# Patient Record
Sex: Male | Born: 1980 | Race: Black or African American | Hispanic: No | Marital: Married | Smoking: Never smoker
Health system: Southern US, Community
[De-identification: ages and names within clinical notes are randomized; demographics above are authoritative.]

## PROBLEM LIST (undated history)

## (undated) HISTORY — PX: WISDOM TOOTH EXTRACTION: SHX21

## (undated) HISTORY — PX: OTHER SURGICAL HISTORY: SHX169

---

## 1999-05-03 ENCOUNTER — Emergency Department (HOSPITAL_COMMUNITY): Admission: EM | Admit: 1999-05-03 | Discharge: 1999-05-03 | Payer: Self-pay | Admitting: Emergency Medicine

## 1999-05-07 ENCOUNTER — Encounter: Payer: Self-pay | Admitting: Emergency Medicine

## 1999-05-07 ENCOUNTER — Emergency Department (HOSPITAL_COMMUNITY): Admission: EM | Admit: 1999-05-07 | Discharge: 1999-05-07 | Payer: Self-pay | Admitting: Emergency Medicine

## 2003-11-11 ENCOUNTER — Emergency Department (HOSPITAL_COMMUNITY): Admission: EM | Admit: 2003-11-11 | Discharge: 2003-11-11 | Payer: Self-pay | Admitting: Family Medicine

## 2011-06-25 ENCOUNTER — Ambulatory Visit (INDEPENDENT_AMBULATORY_CARE_PROVIDER_SITE_OTHER): Payer: BC Managed Care – PPO | Admitting: Internal Medicine

## 2011-06-25 VITALS — BP 130/79 | HR 62 | Temp 98.3°F | Resp 16 | Ht 70.25 in | Wt 176.0 lb

## 2011-06-25 DIAGNOSIS — S39012A Strain of muscle, fascia and tendon of lower back, initial encounter: Secondary | ICD-10-CM

## 2011-06-25 MED ORDER — CYCLOBENZAPRINE HCL 10 MG PO TABS: 10.0000 mg | ORAL_TABLET | Freq: Every day | ORAL | Status: AC

## 2011-06-25 NOTE — Progress Notes (Signed)
  Subjective:    Patient ID: Jesse Carney, male    DOB: 01/19/1981, 31 y.o.   MRN: [id number]  HPI Presents with a four-week history of low back pain Started after a wrestling event with his friends Jesse Carney is a persistent dull pain present most of the day when sitting or driving Rarely affects night time There is some radicular symptoms to the lateral right buttock   Review of [employer] with no prior back injury     Objective:   Physical Exam Vital signs stable Lumbosacral area he has tenderness over the sacral area bilaterally but not the coccyx and not above the iliac crest Straight leg raise is tight but full to 90 DTRs are symmetrical and full Pain is exaggerated by twist or reach There is no peripheral sensory changes       Assessment & Plan:  Problem #1 lumbosacral strain  Handout given for her rehabilitation exercises Flexeril 10 at bedtime and Mobic 15 daily for 2-3 weeks If not will follow up for reevaluation and consideration for PT

## 2012-09-12 ENCOUNTER — Ambulatory Visit: Payer: BC Managed Care – PPO

## 2012-09-12 ENCOUNTER — Ambulatory Visit (INDEPENDENT_AMBULATORY_CARE_PROVIDER_SITE_OTHER): Payer: BC Managed Care – PPO | Admitting: Family Medicine

## 2012-09-12 VITALS — BP 124/74 | HR 58 | Temp 98.0°F | Resp 16 | Ht 72.0 in | Wt 175.0 lb

## 2012-09-12 DIAGNOSIS — M549 Dorsalgia, unspecified: Secondary | ICD-10-CM

## 2012-09-12 DIAGNOSIS — G2589 Other specified extrapyramidal and movement disorders: Secondary | ICD-10-CM

## 2012-09-12 DIAGNOSIS — R279 Unspecified lack of coordination: Secondary | ICD-10-CM

## 2012-09-12 DIAGNOSIS — M546 Pain in thoracic spine: Secondary | ICD-10-CM

## 2012-09-12 DIAGNOSIS — T148XXA Other injury of unspecified body region, initial encounter: Secondary | ICD-10-CM

## 2012-09-12 MED ORDER — TRAMADOL HCL 50 MG PO TABS: 50.0000 mg | ORAL_TABLET | Freq: Three times a day (TID) | ORAL | Status: DC | PRN

## 2012-09-12 MED ORDER — METHOCARBAMOL 500 MG PO TABS: 500.0000 mg | ORAL_TABLET | Freq: Two times a day (BID) | ORAL | Status: DC | PRN

## 2012-09-12 NOTE — Patient Instructions (Signed)
Shoulder Exercises EXERCISES  RANGE OF MOTION (ROM) AND STRETCHING EXERCISES These exercises may help you when beginning to rehabilitate your injury. Your symptoms may resolve with or without further involvement from your physician, physical therapist or athletic trainer. While completing these exercises, remember:   Restoring tissue flexibility helps normal motion to return to the joints. This allows healthier, less painful movement and activity.  An effective stretch should be held for at least 30 seconds.  A stretch should never be painful. You should only feel a gentle lengthening or release in the stretched tissue. ROM - Pendulum  Bend at the waist so that your right / left arm falls away from your body. Support yourself with your opposite hand on a solid surface, such as a table or a countertop.  Your right / left arm should be perpendicular to the ground. If it is not perpendicular, you need to lean over farther. Relax the muscles in your right / left arm and shoulder as much as possible.  Gently sway your hips and trunk so they move your right / left arm without any use of your right / left shoulder muscles.  Progress your movements so that your right / left arm moves side to side, then forward and backward, and finally, both clockwise and counterclockwise.  Complete __________ repetitions in each direction. Many people use this exercise to relieve discomfort in their shoulder as well as to gain range of motion. Repeat __________ times. Complete this exercise __________ times per day. STRETCH  Flexion, Standing  Stand with good posture. With an underhand grip on your right / left hand and an overhand grip on the opposite hand, grasp a broomstick or cane so that your hands are a little more than shoulder-width apart.  Keeping your right / left elbow straight and shoulder muscles relaxed, push the stick with your opposite hand to raise your right / left arm in front of your body and  then overhead. Raise your arm until you feel a stretch in your right / left shoulder, but before you have increased shoulder pain.  Try to avoid shrugging your right / left shoulder as your arm rises by keeping your shoulder blade tucked down and toward your mid-back spine. Hold __________ seconds.  Slowly return to the starting position. Repeat __________ times. Complete this exercise __________ times per day. STRETCH - Internal Rotation  Place your right / left hand behind your back, palm-up.  Throw a towel or belt over your opposite shoulder. Grasp the towel/belt with your right / left hand.  While keeping an upright posture, gently pull up on the towel/belt until you feel a stretch in the front of your right / left shoulder.  Avoid shrugging your right / left shoulder as your arm rises by keeping your shoulder blade tucked down and toward your mid-back spine.  Hold __________. Release the stretch by lowering your opposite hand. Repeat __________ times. Complete this exercise __________ times per day. STRETCH - External Rotation and Abduction  Stagger your stance through a doorframe. It does not matter which foot is forward.  As instructed by your physician, physical therapist or athletic trainer, place your hands:  And forearms above your head and on the door frame.  And forearms at head-height and on the door frame.  At elbow-height and on the door frame.  Keeping your head and chest upright and your stomach muscles tight to prevent over-extending your low-back, slowly shift your weight onto your front foot until you feel   a stretch across your chest and/or in the front of your shoulders.  Hold __________ seconds. Shift your weight to your back foot to release the stretch. Repeat __________ times. Complete this stretch __________ times per day.  STRENGTHENING EXERCISES  These exercises may help you when beginning to rehabilitate your injury. They may resolve your symptoms with  or without further involvement from your physician, physical therapist or athletic trainer. While completing these exercises, remember:   Muscles can gain both the endurance and the strength needed for everyday activities through controlled exercises.  Complete these exercises as instructed by your physician, physical therapist or athletic trainer. Progress the resistance and repetitions only as guided.  You may experience muscle soreness or fatigue, but the pain or discomfort you are trying to eliminate should never worsen during these exercises. If this pain does worsen, stop and make certain you are following the directions exactly. If the pain is still present after adjustments, discontinue the exercise until you can discuss the trouble with your clinician.  If advised by your physician, during your recovery, avoid activity or exercises which involve actions that place your right / left hand or elbow above your head or behind your back or head. These positions stress the tissues which are trying to heal. STRENGTH - Scapular Depression and Adduction  With good posture, sit on a firm chair. Supported your arms in front of you with pillows, arm rests or a table top. Have your elbows in line with the sides of your body.  Gently draw your shoulder blades down and toward your mid-back spine. Gradually increase the tension without tensing the muscles along the top of your shoulders and the back of your neck.  Hold for __________ seconds. Slowly release the tension and relax your muscles completely before completing the next repetition.  After you have practiced this exercise, remove the arm support and complete it in standing as well as sitting. Repeat __________ times. Complete this exercise __________ times per day.  STRENGTH - External Rotators  Secure a rubber exercise band/tubing to a fixed object so that it is at the same height as your right / left elbow when you are standing or sitting on a  firm surface.  Stand or sit so that the secured exercise band/tubing is at your side that is not injured.  Bend your elbow 90 degrees. Place a folded towel or small pillow under your right / left arm so that your elbow is a few inches away from your side.  Keeping the tension on the exercise band/tubing, pull it away from your body, as if pivoting on your elbow. Be sure to keep your body steady so that the movement is only coming from your shoulder rotating.  Hold __________ seconds. Release the tension in a controlled manner as you return to the starting position. Repeat __________ times. Complete this exercise __________ times per day.  STRENGTH - Supraspinatus  Stand or sit with good posture. Grasp a __________ weight or an exercise band/tubing so that your hand is "thumbs-up," like when you shake hands.  Slowly lift your right / left hand from your thigh into the air, traveling about 30 degrees from straight out at your side. Lift your hand to shoulder height or as far as you can without increasing any shoulder pain. Initially, many people do not lift their hands above shoulder height.  Avoid shrugging your right / left shoulder as your arm rises by keeping your shoulder blade tucked down and toward your   mid-back spine.  Hold for __________ seconds. Control the descent of your hand as you slowly return to your starting position. Repeat __________ times. Complete this exercise __________ times per day.  STRENGTH - Shoulder Extensors  Secure a rubber exercise band/tubing so that it is at the height of your shoulders when you are either standing or sitting on a firm arm-less chair.  With a thumbs-up grip, grasp an end of the band/tubing in each hand. Straighten your elbows and lift your hands straight in front of you at shoulder height. Step back away from the secured end of band/tubing until it becomes tense.  Squeezing your shoulder blades together, pull your hands down to the sides of  your thighs. Do not allow your hands to go behind you.  Hold for __________ seconds. Slowly ease the tension on the band/tubing as you reverse the directions and return to the starting position. Repeat __________ times. Complete this exercise __________ times per day.  STRENGTH - Scapular Retractors  Secure a rubber exercise band/tubing so that it is at the height of your shoulders when you are either standing or sitting on a firm arm-less chair.  With a palm-down grip, grasp an end of the band/tubing in each hand. Straighten your elbows and lift your hands straight in front of you at shoulder height. Step back away from the secured end of band/tubing until it becomes tense.  Squeezing your shoulder blades together, draw your elbows back as you bend them. Keep your upper arm lifted away from your body throughout the exercise.  Hold __________ seconds. Slowly ease the tension on the band/tubing as you reverse the directions and return to the starting position. Repeat __________ times. Complete this exercise __________ times per day. STRENGTH  Scapular Depressors  Find a sturdy chair without wheels, such as a from a dining room table.  Keeping your feet on the floor, lift your bottom from the seat and lock your elbows.  Keeping your elbows straight, allow gravity to pull your body weight down. Your shoulders will rise toward your ears.  Raise your body against gravity by drawing your shoulder blades down your back, shortening the distance between your shoulders and ears. Although your feet should always maintain contact with the floor, your feet should progressively support less body weight as you get stronger.  Hold __________ seconds. In a controlled and slow manner, lower your body weight to begin the next repetition. Repeat __________ times. Complete this exercise __________ times per day.  Document Released: 01/25/2005 Document Revised: 06/05/2011 Document Reviewed: 06/25/2008 [facility]  Patient Information 2014 [place], [place]. Scapular Winging  with Rehab  Scapular winging syndrome is also known as serratus anterior palsy or long thoracic nerve injury. The condition is an uncommon injury to the nervous system. The condition is caused by injury to the long thoracic nerve that runs through the neck and shoulder. Injury to the shoulder, such as a fall or repetitive stress on the shoulder causes the nerve to become stretched. Occasionally the injury is the result of an infection of the nerve. Damage to the long thoracic nerve results in weakness of the serratus anterior muscle. The serratus anterior muscle is responsible for controlling the shoulder blade (scapula). Weakness in this muscle results in a instability (winging) of the scapula. SYMPTOMS   Pain and weakness in the shoulder (usually the back of the shoulder) that is often diffuse or unable to localize.  Loss of or decrease in shoulder function.  Upper back pain while sitting,  due to the scapula pressing on the back of the chair.  Visible deformity in the back of the shoulder. CAUSES  Scapular winging is caused by stretching of the long thoracic nerve. Common mechanisms of injury include:  Viral illness.  Repetitive and/or stressful use of the shoulder.  Falling onto the shoulder with the head and neck stretched away from the shoulder. RISK INCREASES WITH:  Contact sports (football, rugby, lacrosse, or soccer).  Activities involving overhead arm movement (baseball, volleyball, or racquet sports).  Poor strength and flexibility. PREVENTION  Warm up and stretch properly before activity.  Allow for adequate recovery between workouts.  Maintain physical fitness:  Strength, flexibility, and endurance.  Cardiovascular fitness.  Learn and use proper technique. When possible, have a coach correct improper technique. PROGNOSIS  Scapular winging normally resolves spontaneously within 18 months. In rare  circumstances surgery is recommended.  RELATED COMPLICATIONS   Permanent nerve damage, including pain, numbness, tingle, or weakness.  Shoulder weakness.  Recurrent shoulder pain.  Inability to compete in athletics. TREATMENT Treatment initially involves resting from any activities that aggravate your symptoms. The use of ice and medication may help reduce pain and inflammation. The use of strengthening and stretching exercises may help reduce pain with activity, specifically shoulder exercises that improve range of motion. These exercises may be performed at home or with referral to a therapist. If symptoms persist for greater than 6 months despite non-surgical (conservative) treatment, then surgery may be recommended. Surgery is only used for the most serious cases and the purpose is to regain function, not to allow an athlete to return to sports. MEDICATION   If pain medication is necessary, then nonsteroidal anti-inflammatory medications, such as aspirin and ibuprofen, or other minor pain relievers, such as acetaminophen, are often recommended.  Do not take pain medication for 7 days before surgery.  Prescription pain relievers may be given if deemed necessary by your caregiver. Use only as directed and only as much as you need. HEAT AND COLD  Cold treatment (icing) relieves pain and reduces inflammation. Cold treatment should be applied for 10 to 15 minutes every 2 to 3 hours for inflammation and pain and immediately after any activity that aggravates your symptoms. Use ice packs or massage the area with a piece of ice (ice massage).  Heat treatment may be used prior to performing the stretching and strengthening activities prescribed by your caregiver, physical therapist, or athletic trainer. Use a heat pack or soak the injury in warm water. SEEK MEDICAL CARE IF:  Treatment seems to offer no benefit, or the condition worsens.  Any medications produce adverse side effects. EXERCISES   RANGE OF MOTION (ROM) AND STRETCHING EXERCISES - Scapular Winging (Serratus Anterior Palsy, Long Thoracic Nerve Injury)  These exercises may help you when beginning to rehabilitate your injury. Your symptoms may resolve with or without further involvement from your physician, physical therapist or athletic trainer. While completing these exercises, remember:   Restoring tissue flexibility helps normal motion to return to the joints. This allows healthier, less painful movement and activity.  An effective stretch should be held for at least 30 seconds.  A stretch should never be painful. You should only feel a gentle lengthening or release in the stretched tissue. ROM - Pendulum  Bend at the waist so that your right / left arm falls away from your body. Support yourself with your opposite hand on a solid surface, such as a table or a countertop.  Your right / left arm  should be perpendicular to the ground. If it is not perpendicular, you need to lean over farther. Relax the muscles in your right / left arm and shoulder as much as possible.  Gently sway your hips and trunk so they move your right / left arm without any use of your right / left shoulder muscles.  Progress your movements so that your right / left arm moves side to side, then forward and backward, and finally, both clockwise and counterclockwise.  Complete __________ repetitions in each direction. Many people use this exercise to relieve discomfort in their shoulder as well as to gain range of motion. Repeat __________ times. Complete this exercise __________ times per day. STRETCH  Flexion, Seated   Sit in a firm chair so that your right / left forearm can rest on a table or on a table or countertop. Your right / left elbow should rest below the height of your shoulder so that your shoulder feels supported and not tense or uncomfortable.  Keeping your right / left shoulder relaxed, lean forward at your waist, allowing your  right / left hand to slide forward. Bend forward until you feel a moderate stretch in your shoulder, but before you feel an increase in your pain.  Hold __________ seconds. Slowly return to your starting position. Repeat __________ times. Complete this exercise __________ times per day.  STRETCH  Flexion, Standing  Stand with good posture. With an underhand grip on your right / left and an overhand grip on the opposite hand, grasp a broomstick or cane so that your hands are a little more than shoulder-width apart.  Keeping your right / left elbow straight and shoulder muscles relaxed, push the stick with your opposite hand to raise your right / left arm in front of your body and then overhead. Raise your arm until you feel a stretch in your right / left shoulder, but before you have increased shoulder pain.  Avoid shrugging your right / left shoulder as your arm rises by keeping your shoulder blade tucked down and toward your mid-back spine. Hold __________ seconds.  Slowly return to the starting position. Repeat __________ times. Complete this exercise __________ times per day. STRETCH  Abduction, Supine  Stand with good posture. With an underhand grip on your right / left and an overhand grip on the opposite hand, grasp a broomstick or cane so that your hands are a little more than shoulder-width apart.  Keeping your right / left elbow straight and shoulder muscles relaxed, push the stick with your opposite hand to raise your right / left arm out to the side of your body and then overhead. Raise your arm until you feel a stretch in your right / left shoulder, but before you have increased shoulder pain.  Avoid shrugging your right / left shoulder as your arm rises by keeping your shoulder blade tucked down and toward your mid-back spine. Hold __________ seconds.  Slowly return to the starting position. Repeat __________ times. Complete this exercise __________ times per day. ROM  Flexion,  Active-Assisted  Lie on your back. You may bend your knees for comfort.  Grasp a broomstick or cane so your hands are about shoulder-width apart. Your right / left hand should grip the end of the stick/cane so that your hand is positioned "thumbs-up," as if you were about to shake hands.  Using your healthy arm to lead, raise your right / left arm overhead until you feel a gentle stretch in your shoulder. Hold  __________ seconds.  Use the stick/cane to assist in returning your right / left arm to its starting position. Repeat __________ times. Complete this exercise __________ times per day.  STRENGTHENING EXERCISES - Scapular Winging (Serratus Anterior Palsy, Long Thoracic Nerve Injury) These exercises may help you when beginning to rehabilitate your injury. They may resolve your symptoms with or without further involvement from your physician, physical therapist or athletic trainer. While completing these exercises, remember:   Muscles can gain both the endurance and the strength needed for everyday activities through controlled exercises.  Complete these exercises as instructed by your physician, physical therapist or athletic trainer. Progress with the resistance and repetition exercises only as your caregiver advises.  You may experience muscle soreness or fatigue, but the pain or discomfort you are trying to eliminate should never worsen during these exercises. If this pain does worsen, stop and make certain you are following the directions exactly. If the pain is still present after adjustments, discontinue the exercise until you can discuss the trouble with your clinician.  During your recovery, avoid activity or exercises which involve actions that place your injured hand or elbow above your head or behind your back or head. These positions stress the tissues which are trying to heal. STRENGTH - Scapular Depression and Adduction   With good posture, sit on a firm chair. Supported your  arms in front of you with pillows, arm rests or a table top. Have your elbows in line with the sides of your body.  Gently draw your shoulder blades down and toward your mid-back spine. Gradually increase the tension without tensing the muscles along the top of your shoulders and the back of your neck.  Hold for __________ seconds. Slowly release the tension and relax your muscles completely before completing the next repetition.  After you have practiced this exercise, remove the arm support and complete it in standing as well as sitting. Repeat __________ times. Complete this exercise __________ times per day.  STRENGTH - Scapular Protractors, Standing   Stand arms-length away from a wall. Place your hands on the wall, keeping your elbows straight.  Begin by dropping your shoulder blades down and toward your mid-back spine.  To strengthen your protractors, keep your shoulder blades down, but slide them forward on your rib cage. It will feel as if you are lifting the back of your rib cage away from the wall. This is a subtle motion and can be challenging to complete. Ask your clinician for further instruction if you are not sure you are doing the exercise correctly.  Hold for __________ seconds. Slowly return to the starting position, resting the muscles completely before completing the next repetition. Repeat __________ times. Complete this exercise __________ times per day. STRENGTH - Scapular Protractors, Supine  Lie on your back on a firm surface. Extend your right / left arm straight into the air while holding a __________ weight in your hand.  Keeping your head and back in place, lift your shoulder off the floor.  Hold __________ seconds. Slowly return to the starting position and allow your muscles to relax completely before completing the next repetition. Repeat __________ times. Complete this exercise __________ times per day. STRENGTH - Scapular Protractors, Quadruped  Get onto  your hands and knees with your shoulders directly over your hands (or as close as you comfortably can be).  Keeping your elbows locked, lift the back of your rib cage up into your shoulder blades so your mid-back rounds-out. Keep your  neck muscles relaxed.  Hold this position for __________ seconds. Slowly return to the starting position and allow your muscles to relax completely before completing the next repetition. Repeat __________ times. Complete this exercise __________ times per day.  STRENGTH  Scapular Depressors  Keeping your feet on the floor, lift your bottom from the seat and lock your elbows.  Keeping your elbows straight, allow gravity to pull your body weight down. Your shoulders will rise toward your ears.  Raise your body against gravity by drawing your shoulder blades down your back, shortening the distance between your shoulders and ears. Although your feet should always maintain contact with the floor, your feet should progressively support less body weight as you get stronger.  Hold __________ seconds. In a controlled and slow manner, lower your body weight to begin the next repetition. Repeat __________ times. Complete this exercise __________ times per day.  STRENGTH - Shoulder Extensors, Prone  Lie on your stomach on a firm surface so that your right / left arm overhangs the edge. Rest your forehead on your opposite forearm. With your thumb facing away from your body and your elbow straight, hold a __________ weight in your hand.  Squeeze your right / left shoulder blade to your mid-back spine and then slowly raise your arm behind you to the height of the bed.  Hold for __________ seconds. Slowly reverse the directions and return to the starting position, controlling the weight as you lower your arm. Repeat __________ times. Complete this exercise __________ times per day.  STRENGTH - Horizontal Abductors Choose one of the two oppositions to complete this  exercise. Prone: lying on stomach:  Lie on your stomach on a firm surface so that your right / left arm overhangs the edge. Rest your forehead on your opposite forearm. With your palm facing the floor and your elbow straight, hold a __________ weight in your hand.  Squeeze your right / left shoulder blade to your mid-back spine and then slowly raise your arm to the height of the bed.  Hold for __________ seconds. Slowly reverse the directions and return to the starting position, controlling the weight as you lower your arm. Repeat __________ times. Complete this exercise __________ times per day. Standing:  Secure a rubber exercise band/tubing so that it is at the height of your shoulders when you are either standing or sitting on a firm arm-less chair.  Grasp an end of the band/tubing in each hand and have your palms face each other. Straighten your elbows and lift your hands straight in front of you at shoulder height. Step back away from the secured end of band/tubing until it becomes tense.  Squeeze your shoulder blades together. Keeping your elbows locked and your hands at shoulder-height, bring your hands out to your side.  Hold __________ seconds. Slowly ease the tension on the band/tubing as you reverse the directions and return to the starting position. Repeat __________ times. Complete this exercise __________ times per day. STRENGTH - Scapular Retractors  Secure a rubber exercise band/tubing so that it is at the height of your shoulders when you are either standing or sitting on a firm arm-less chair.  With a palm-down grip, grasp an end of the band/tubing in each hand. Straighten your elbows and lift your hands straight in front of you at shoulder height. Step back away from the secured end of band/tubing until it becomes tense.  Squeezing your shoulder blades together, draw your elbows back as you bend them. Keep  your upper arm lifted away from your body throughout the  exercise.  Hold __________ seconds. Slowly ease the tension on the band/tubing as you reverse the directions and return to the starting position. Repeat __________ times. Complete this exercise __________ times per day. STRENGTH - Shoulder Extensors   Secure a rubber exercise band/tubing so that it is at the height of your shoulders when you are either standing or sitting on a firm arm-less chair.  With a thumbs-up grip, grasp an end of the band/tubing in each hand. Straighten your elbows and lift your hands straight in front of you at shoulder height. Step back away from the secured end of band/tubing until it becomes tense.  Squeezing your shoulder blades together, pull your hands down to the sides of your thighs. Do not allow your hands to go behind you.  Hold for __________ seconds. Slowly ease the tension on the band/tubing as you reverse the directions and return to the starting position. Repeat __________ times. Complete this exercise __________ times per day.  STRENGTH - Scapular Retractors and External Rotators  Secure a rubber exercise band/tubing so that it is at the height of your shoulders when you are either standing or sitting on a firm arm-less chair.  With a palm-down grip, grasp an end of the band/tubing in each hand. Bend your elbows 90 degrees and lift your elbows to shoulder height at your sides. Step back away from the secured end of band/tubing until it becomes tense.  Squeezing your shoulder blades together, rotate your shoulder so that your upper arm and elbow remain stationary, but your fists travel upward to head-height.  Hold __________ for seconds. Slowly ease the tension on the band/tubing as you reverse the directions and return to the starting position. Repeat __________ times. Complete this exercise __________ times per day.  STRENGTH - Scapular Retractors and External Rotators, Rowing  Secure a rubber exercise band/tubing so that it is at the height of your  shoulders when you are either standing or sitting on a firm arm-less chair.  With a palm-down grip, grasp an end of the band/tubing in each hand. Straighten your elbows and lift your hands straight in front of you at shoulder height. Step back away from the secured end of band/tubing until it becomes tense.  Step 1: Squeeze your shoulder blades together. Bending your elbows, draw your hands to your chest as if you are rowing a boat. At the end of this motion, your hands and elbow should be at shoulder-height and your elbows should be out to your sides.  Step 2: Rotate your shoulder to raise your hands above your head. Your forearms should be vertical and your upper-arms should be horizontal.  Hold for __________ seconds. Slowly ease the tension on the band/tubing as you reverse the directions and return to the starting position. Repeat __________ times. Complete this exercise __________ times per day.  STRENGTH - Scapular Retractors and Elevators  Secure a rubber exercise band/tubing so that it is at the height of your shoulders when you are either standing or sitting on a firm arm-less chair.  With a thumbs-up grip, grasp an end of the band/tubing in each hand. Step back away from the secured end of band/tubing until it becomes tense.  Squeezing your shoulder blades together, straighten your elbows and lift your hands straight over your head.  Hold for __________ seconds. Slowly ease the tension on the band/tubing as you reverse the directions and return to the starting position. Repeat  __________ times. Complete this exercise __________ times per day.  °Document Released: 03/13/2005 Document Revised: 06/05/2011 Document Reviewed: 06/25/2008 °ExitCare® Patient Information ©2014 [employer]. ° °

## 2012-09-12 NOTE — Progress Notes (Signed)
[facility]:  Office Visit  Chief Complaint:  Chief Complaint  Patient presents with  . pain upper back  . rt.shoulder pain    HPI: Jesse Carney is a 32 y.o. male who complains of  Right shoulder pain since Monday x 4 days. He worked out Friday, weight liting, 210 lbs on bench press without problems, thsi is normal for him. Throbbing, aching pain underneath right shoulder blade near center of back. Lambert Mody and achey with movement. Constant, all day any movement makes it hurt worse. He has swelling with NSAIDs. Has tried tylenol without releif. He injured shoulder before as a child. No prior surgeies. Denies numbness, weakness por tinglign. + nausea due to the pain.   History reviewed. No pertinent past medical history. Past Surgical History  Procedure Laterality Date  . Bone correction     History   Social History  . Marital Status: Single    Spouse Name: N/A    Number of Children: N/A  . Years of Education: N/A   Social History Main Topics  . Smoking status: Never Smoker   . Smokeless tobacco: None  . Alcohol Use: No  . Drug Use: No  . Sexually Active: No   Other Topics Concern  . None   Social History Narrative  . None   Family History  Problem Relation Age of Onset  . Hypertension Mother   . Hypertension Father    Allergies  Allergen Reactions  . Ibuprofen Swelling  . Nsaids Swelling    Allergic to any anti-inflammatories   Prior to Admission medications   Medication Sig Start Date End Date Taking? Authorizing Provider  HYDROcodone-acetaminophen (NORCO) 10-325 MG per tablet Take 1 tablet by mouth every 6 (six) hours as needed for pain.   Yes Historical Provider, MD     ROS: The patient denies fevers, chills, night sweats, unintentional weight loss, chest pain, palpitations, wheezing, dyspnea on exertion, nausea, vomiting, abdominal pain, dysuria, hematuria, melena, numbness, weakness, or tingling.   All other systems have been  reviewed and were otherwise negative with the exception of those mentioned in the HPI and as above.    PHYSICAL EXAM: Filed Vitals:   09/12/12 1803  BP: 124/74  Pulse: 58  Temp: 98 F (36.7 C)  Resp: 16   Filed Vitals:   09/12/12 1803  Height: 6' (1.829 m)  Weight: 175 lb (79.379 kg)   Body mass index is 23.73 kg/(m^2).  General: Alert, no acute distress HEENT:  Normocephalic, atraumatic, oropharynx patent.  Cardiovascular:  Regular rate and rhythm, no rubs murmurs or gallops.  No Carotid bruits, radial pulse intact. No pedal edema.  Respiratory: Clear to auscultation bilaterally.  No wheezes, rales, or rhonchi.  No cyanosis, no use of accessory musculature GI: No organomegaly, abdomen is soft and non-tender, positive bowel sounds.  No masses. Skin: No rashes. Neurologic: Facial musculature symmetric. Psychiatric: Patient is appropriate throughout our interaction. Lymphatic: No cervical lymphadenopathy Musculoskeletal: Gait intact. Right scapular slighly weaker and higher than left--mild scapular dyskinesia, not scapular winging Neck-normal exam Shoulder except scap dys, normal exam 5/5 strength, sensation intact  LABS: No results found for this or any previous visit.   EKG/XRAY:   Primary read interpreted by Dr. Conley Rolls at [facility]. No fx/dislocation scapula   ASSESSMENT/PLAN: Encounter Diagnoses  Name Primary?  Marland Kitchen Upper back pain on right side   . Scapular dyskinesis Yes  . Sprain and strain    Scapula xrays were normal, I did not  do shoulder xrays because he had no problems with his shoulder Robaxin, Tramadol prn  May use volatren if no SE since had it before F/u in 2 weeks if no improvement then will send to PT, consider trigger point injection Home exercises given Gross sideeffects, risk and benefits, and alternatives of medications d/w patient. Patient is aware that all medications have potential sideeffects and we are unable to predict every sideeffect or drug-drug  interaction that may occur.     Hamilton Capri PHUONG, DO 09/12/2012 7:15 PM

## 2014-08-11 ENCOUNTER — Ambulatory Visit (INDEPENDENT_AMBULATORY_CARE_PROVIDER_SITE_OTHER): Payer: 59

## 2014-08-11 ENCOUNTER — Ambulatory Visit (INDEPENDENT_AMBULATORY_CARE_PROVIDER_SITE_OTHER): Payer: 59 | Admitting: Internal Medicine

## 2014-08-11 VITALS — BP 110/70 | HR 70 | Temp 98.0°F | Resp 18 | Ht 72.0 in | Wt 173.2 lb

## 2014-08-11 DIAGNOSIS — R05 Cough: Secondary | ICD-10-CM

## 2014-08-11 DIAGNOSIS — R059 Cough, unspecified: Secondary | ICD-10-CM

## 2014-08-11 LAB — POCT CBC
Granulocyte percent: 46.3 %G (ref 37–80)
HCT, POC: 43.5 % (ref 43.5–53.7)
Hemoglobin: 14.1 g/dL (ref 14.1–18.1)
Lymph, poc: 1.7 (ref 0.6–3.4)
MCH, POC: 28.1 pg (ref 27–31.2)
MCHC: 32.4 g/dL (ref 31.8–35.4)
MCV: 86.7 fL (ref 80–97)
MID (cbc): 0.3 (ref 0–0.9)
MPV: 7.2 fL (ref 0–99.8)
POC Granulocyte: 1.7 — AB (ref 2–6.9)
POC LYMPH PERCENT: 46 %L (ref 10–50)
POC MID %: 7.7 %M (ref 0–12)
Platelet Count, POC: 191 10*3/uL (ref 142–424)
RBC: 5.01 M/uL (ref 4.69–6.13)
RDW, POC: 13.7 %
WBC: 3.6 10*3/uL — AB (ref 4.6–10.2)

## 2014-08-11 MED ORDER — AZITHROMYCIN 500 MG PO TABS: 500.0000 mg | ORAL_TABLET | Freq: Every day | ORAL | Status: DC

## 2014-08-11 MED ORDER — METHYLPREDNISOLONE ACETATE 80 MG/ML IJ SUSP
120.0000 mg | Freq: Once | INTRAMUSCULAR | Status: AC
  Administered 2014-08-11: 120 mg via INTRAMUSCULAR

## 2014-08-11 NOTE — Progress Notes (Signed)
   Subjective:    Patient ID: Jesse BoatmanBrandon B Carney, male    DOB: [birth year], 34 y.o.   MRN: [id number]  HPI Jesse Carney is a 34 y.o. Male presenting today with a productive cough and chest congestion x 1 month. Denies night sweats, fever, fatigue, nausea, vomiting, and wheezing. He has a 650 L/min which is predicted. He states he coughs throughout the day. He also denies seasonal allergies and sneezing.  No wheezing,, never on inhaler, no smoke.   Review of Systems     Objective:   Physical Exam  Constitutional: He is oriented to person, place, and time. He appears well-developed and well-nourished.  HENT:  Head: Normocephalic.  Right Ear: External ear normal.  Left Ear: External ear normal.  Nose: Nose normal.  Mouth/Throat: Oropharynx is clear and moist.  Eyes: Conjunctivae and EOM are normal. Pupils are equal, round, and reactive to light.  Neck: Normal range of motion. Neck supple.  Cardiovascular: Normal rate, regular rhythm and normal heart sounds.   Pulmonary/Chest: Effort normal. No tachypnea. No respiratory distress. He has decreased breath sounds. He has no wheezes. He has no rhonchi. He has no rales. He exhibits no tenderness.  PFR 650 l/min  normal  Lymphadenopathy:    He has no cervical adenopathy.  Neurological: He is alert and oriented to person, place, and time. He exhibits normal muscle tone. Coordination normal.  Psychiatric: He has a normal mood and affect. His behavior is normal.   Results for orders placed or performed in visit on 08/11/14  POCT CBC  Result Value Ref Range   WBC 3.6 (A) 4.6 - 10.2 K/uL   Lymph, poc 1.7 0.6 - 3.4   POC LYMPH PERCENT 46.0 10 - 50 %L   MID (cbc) 0.3 0 - 0.9   POC MID % 7.7 0 - 12 %M   POC Granulocyte 1.7 (A) 2 - 6.9   Granulocyte percent 46.3 37 - 80 %G   RBC 5.01 4.69 - 6.13 M/uL   Hemoglobin 14.1 14.1 - 18.1 g/dL   HCT, POC 78.243.5 95.643.5 - 53.7 %   MCV 86.7 80 - 97 fL   MCH, POC 28.1 27 - 31.2 pg   MCHC 32.4 31.8 - 35.4 g/dL   RDW, POC 21.313.7 %   Platelet Count, POC 191 142 - 424 K/uL   MPV 7.2 0 - 99.8 fL      [facility] reading (PRIMARY) by  Dr Perrin MalteseGuest hyperinflation, no pneumothorax       Assessment & Plan:  Cough/Cause unclear/Fatigue Possible subclinical bronchospasm Zpak/Depomedrol

## 2014-08-11 NOTE — Patient Instructions (Signed)
Bronchospasm °A bronchospasm is a spasm or tightening of the airways going into the lungs. During a bronchospasm breathing becomes more difficult because the airways get smaller. When this happens there can be coughing, a whistling sound when breathing (wheezing), and difficulty breathing. Bronchospasm is often associated with asthma, but not all patients who experience a bronchospasm have asthma. °CAUSES  °A bronchospasm is caused by inflammation or irritation of the airways. The inflammation or irritation may be triggered by:  °· Allergies (such as to animals, pollen, food, or mold). Allergens that cause bronchospasm may cause wheezing immediately after exposure or many hours later.   °· Infection. Viral infections are believed to be the most common cause of bronchospasm.   °· Exercise.   °· Irritants (such as pollution, cigarette smoke, strong odors, aerosol sprays, and paint fumes).   °· Weather changes. Winds increase molds and pollens in the air. Rain refreshes the air by washing irritants out. Cold air may cause inflammation.   °· Stress and emotional upset.   °SIGNS AND SYMPTOMS  °· Wheezing.   °· Excessive nighttime coughing.   °· Frequent or severe coughing with a simple cold.   °· Chest tightness.   °· Shortness of breath.   °DIAGNOSIS  °Bronchospasm is usually diagnosed through a history and physical exam. Tests, such as chest X-rays, are sometimes done to look for other conditions. °TREATMENT  °· Inhaled medicines can be given to open up your airways and help you breathe. The medicines can be given using either an inhaler or a nebulizer machine. °· Corticosteroid medicines may be given for severe bronchospasm, usually when it is associated with asthma. °HOME CARE INSTRUCTIONS  °· Always have a plan prepared for seeking medical care. Know when to call your health care provider and local emergency services (911 in the [place] Know where you can access local emergency care. °· Only take medicines as  directed by your health care provider. °· If you were prescribed an inhaler or nebulizer machine, ask your health care provider to explain how to use it correctly. Always use a spacer with your inhaler if you were given one. °· It is necessary to remain calm during an attack. Try to relax and breathe more slowly.  °· Control your home environment in the following ways:   °· Change your heating and air conditioning filter at least once a month.   °· Limit your use of fireplaces and wood stoves. °· Do not smoke and do not allow smoking in your home.   °· Avoid exposure to perfumes and fragrances.   °· Get rid of pests (such as roaches and mice) and their droppings.   °· Throw away plants if you see mold on them.   °· Keep your house clean and dust free.   °· Replace carpet with wood, tile, or vinyl flooring. Carpet can trap dander and dust.   °· Use allergy-proof pillows, mattress covers, and box spring covers.   °· Wash bed sheets and blankets every week in hot water and dry them in a dryer.   °· Use blankets that are made of polyester or cotton.   °· Wash hands frequently. °SEEK MEDICAL CARE IF:  °· You have muscle aches.   °· You have chest pain.   °· The sputum changes from clear or white to yellow, green, gray, or bloody.   °· The sputum you cough up gets thicker.   °· There are problems that may be related to the medicine you are given, such as a rash, itching, swelling, or trouble breathing.   °SEEK IMMEDIATE MEDICAL CARE IF:  °· You have worsening wheezing and coughing even   after taking your prescribed medicines.   °· You have increased difficulty breathing.   °· You develop severe chest pain. °MAKE SURE YOU:  °· Understand these instructions. °· Will watch your condition. °· Will get help right away if you are not doing well or get worse. °Document Released: 03/16/2003 Document Revised: 03/18/2013 Document Reviewed: 09/02/2012 °ExitCare® Patient Information ©2015 [employer]. This information is not  intended to replace advice given to you by your health care provider. Make sure you discuss any questions you have with your health care provider. ° °Cough, Adult ° A cough is a reflex that helps clear your throat and airways. It can help heal the body or may be a reaction to an irritated airway. A cough may only last 2 or 3 weeks (acute) or may last more than 8 weeks (chronic).  °CAUSES °Acute cough: °· Viral or bacterial infections. °Chronic cough: °· Infections. °· Allergies. °· Asthma. °· Post-nasal drip. °· Smoking. °· Heartburn or acid reflux. °· Some medicines. °· Chronic lung problems (COPD). °· Cancer. °SYMPTOMS  °· Cough. °· Fever. °· Chest pain. °· Increased breathing rate. °· High-pitched whistling sound when breathing (wheezing). °· Colored mucus that you cough up (sputum). °TREATMENT  °· A bacterial cough may be treated with antibiotic medicine. °· A viral cough must run its course and will not respond to antibiotics. °· Your caregiver may recommend other treatments if you have a chronic cough. °HOME CARE INSTRUCTIONS  °· Only take over-the-counter or prescription medicines for pain, discomfort, or fever as directed by your caregiver. Use cough suppressants only as directed by your caregiver. °· Use a cold steam vaporizer or humidifier in your bedroom or home to help loosen secretions. °· Sleep in a semi-upright position if your cough is worse at night. °· Rest as needed. °· Stop smoking if you smoke. °SEEK IMMEDIATE MEDICAL CARE IF:  °· You have pus in your sputum. °· Your cough starts to worsen. °· You cannot control your cough with suppressants and are losing sleep. °· You begin coughing up blood. °· You have difficulty breathing. °· You develop pain which is getting worse or is uncontrolled with medicine. °· You have a fever. °MAKE SURE YOU:  °· Understand these instructions. °· Will watch your condition. °· Will get help right away if you are not doing well or get worse. °Document Released:  09/09/2010 Document Revised: 06/05/2011 Document Reviewed: 09/09/2010 °ExitCare® Patient Information ©2015 [employer]. This information is not intended to replace advice given to you by your health care provider. Make sure you discuss any questions you have with your health care provider. ° °

## 2016-05-22 ENCOUNTER — Telehealth: Payer: Self-pay

## 2016-05-22 ENCOUNTER — Ambulatory Visit (INDEPENDENT_AMBULATORY_CARE_PROVIDER_SITE_OTHER): Payer: Managed Care, Other (non HMO) | Admitting: Emergency Medicine

## 2016-05-22 ENCOUNTER — Ambulatory Visit
Admission: RE | Admit: 2016-05-22 | Discharge: 2016-05-22 | Disposition: A | Discharge status: Home / Self Care | Payer: Managed Care, Other (non HMO) | Source: Ambulatory Visit | Attending: Emergency Medicine | Admitting: Emergency Medicine

## 2016-05-22 VITALS — BP 122/80 | HR 69 | Temp 97.9°F | Resp 18 | Ht 72.0 in | Wt 177.0 lb

## 2016-05-22 DIAGNOSIS — S0990XA Unspecified injury of head, initial encounter: Secondary | ICD-10-CM

## 2016-05-22 DIAGNOSIS — G44319 Acute post-traumatic headache, not intractable: Secondary | ICD-10-CM | POA: Diagnosis not present

## 2016-05-22 NOTE — Patient Instructions (Addendum)
   IF you received an x-ray today, you will receive an invoice from [facility]. Please contact [facility] at [phone] with questions or concerns regarding your invoice.   IF you received labwork today, you will receive an invoice from [facility]. Please contact [facility] at [phone] with questions or concerns regarding your invoice.   Our billing staff will not be able to assist you with questions regarding bills from these companies.  You will be contacted with the lab results as soon as they are available. The fastest way to get your results is to activate your My Chart account. Instructions are located on the last page of this paperwork. If you have not heard from us regarding the results in 2 weeks, please contact this office.     Head Injury, Adult There are many types of head injuries. They can be as minor as a bump. Some head injuries can be worse. Worse injuries include:  A strong hit to the head that hurts the brain (concussion).  A bruise of the brain (contusion). This means there is bleeding in the brain that can cause swelling.  A cracked skull (skull fracture).  Bleeding in the brain that gathers, gets thick (makes a clot), and forms a bump (hematoma). Most problems from a head injury come in the first 24 hours. However, you may still have side effects up to 7-10 days after your injury. It is important to watch your condition for any changes. Follow these instructions at home: Activity  Rest as much as possible.  Avoid activities that are hard or tiring.  Make sure you get enough sleep.  Limit activities that need a lot of thought or attention, such as:  Watching TV.  Playing memory games and puzzles.  Job-related work or homework.  Working on the computer, social media, and texting.  Avoid activities that could cause another head injury until your doctor says it is okay. This includes playing sports.  Ask your doctor when it is  safe for you to go back to your normal activities, such as work or school. Ask your doctor for a step-by-step plan for slowly going back to your normal activities.  Ask your doctor when you can drive, ride a bicycle, or use heavy machinery. Never do these activities if you are dizzy. Lifestyle  Do not drink alcohol until your doctor says it is okay.  Avoid drug use.  If it is harder than usual to remember things, write them down.  If you are easily distracted, try to do one thing at a time.  Talk with family members or close friends when making important decisions.  Tell your friends, family, a trusted coworker, and work manager about your injury, symptoms, and limits (restrictions). Have them watch for any problems that are new or getting worse. General instructions  Take over-the-counter and prescription medicines only as told by your doctor.  Have someone stay with you for 24 hours after your head injury. This person should watch you for any changes in your symptoms and be ready to get help.  Keep all follow-up visits as told by your doctor. This is important. Prevention  Work on your balance and strength. This can help you avoid falls.  Wear a seatbelt when you are in a moving vehicle.  Wear a helmet when:  Riding a bicycle.  Skiing.  Doing any other sport or activity that has a risk of injury.  Drink alcohol only in moderation.  Make your home safer by:    Getting rid of clutter from the floors and stairs, like things that can make you trip.  Using grab bars in bathrooms and handrails by stairs.  Placing non-slip mats on floors and in bathtubs.  Putting more light in dim areas. Get help right away if:  You have:  A very bad (severe) headache that is not helped by medicine.  Trouble walking or weakness in your arms and legs.  Clear or bloody fluid coming from your nose or ears.  Changes in your seeing (vision).  Jerky movements that you cannot control  (seizure).  You throw up (vomit).  Your symptoms get worse.  You lose balance.  Your speech is slurred.  You pass out.  You are sleepier and have trouble staying awake.  The black centers of your eyes (pupils) change in size. These symptoms may be an emergency. Do not wait to see if the symptoms will go away. Get medical help right away. Call your local emergency services (911 in the [place] Do not drive yourself to the hospital. This information is not intended to replace advice given to you by your health care provider. Make sure you discuss any questions you have with your health care provider. Document Released: 02/24/2008 Document Revised: 10/07/2015 Document Reviewed: 09/21/2015 [employer] Interactive Patient Education  2017 [employer]  

## 2016-05-22 NOTE — Progress Notes (Signed)
Jesse BoatmanBrandon B Carney 35 y.o.   Chief Complaint  Patient presents with  . Head Injury    3 WEEKS / HAVING HEAD FROM TOP OF HEAD TO FRONT OF FACE    HISTORY OF PRESENT ILLNESS: This is a 36 y.o. male complaining of left sided headache since hitting head 3 weeks ago.  Head Injury   The incident occurred more than 1 week ago. The injury mechanism was a direct blow. There was no loss of consciousness. The quality of the pain is described as sharp and aching (radiates to left side of face). The pain is at a severity of 3/10. The pain is mild. The pain has been fluctuating since the injury. Associated symptoms include headaches. Pertinent negatives include no blurred vision, memory loss, numbness, vomiting or weakness. He has tried acetaminophen for the symptoms. The treatment provided mild relief.     Prior to Admission medications   Medication Sig Start Date End Date Taking? Authorizing Provider  HYDROcodone-acetaminophen (NORCO) 10-325 MG per tablet Take 1 tablet by mouth every 6 (six) hours as needed for pain.    Historical Provider, MD  methocarbamol (ROBAXIN) 500 MG tablet Take 1 tablet (500 mg total) by mouth 2 (two) times daily as needed. Patient not taking: Reported on 08/11/2014 09/12/12   Thao P Le, DO  traMADol (ULTRAM) 50 MG tablet Take 1 tablet (50 mg total) by mouth every 8 (eight) hours as needed for pain. Patient not taking: Reported on 08/11/2014 09/12/12   Thao P Le, DO    Allergies  Allergen Reactions  . Ibuprofen Swelling  . Nsaids Swelling    Allergic to any anti-inflammatories    There are no active problems to display for this patient.   History reviewed. No pertinent past medical history.  Past Surgical History:  Procedure Laterality Date  . bone correction      Social History   Social History  . Marital status: Single    Spouse name: N/A  . Number of children: N/A  . Years of education: N/A   Occupational History  . Not on file.   Social History Main  Topics  . Smoking status: Never Smoker  . Smokeless tobacco: Never Used  . Alcohol use No  . Drug use: No  . Sexual activity: No   Other Topics Concern  . Not on file   Social History Narrative  . No narrative on file    Family History  Problem Relation Age of Onset  . Hypertension Mother   . Hypertension Father      Review of Systems  Constitutional: Negative for chills and fever.  HENT: Negative for ear pain and hearing loss.   Eyes: Negative for blurred vision and double vision.  Respiratory: Negative for cough and shortness of breath.   Cardiovascular: Negative for chest pain.  Gastrointestinal: Negative for vomiting.  Genitourinary: Negative for dysuria and hematuria.  Skin: Negative for rash.  Neurological: Positive for headaches. Negative for dizziness, tingling, sensory change, speech change, focal weakness, loss of consciousness, weakness and numbness.  Psychiatric/Behavioral: Negative for memory loss.  All other systems reviewed and are negative.  Vitals:   05/22/16 1110  BP: 122/80  Pulse: 69  Resp: 18  Temp: 97.9 F (36.6 C)     Physical Exam  Constitutional: He is oriented to person, place, and time. He appears well-developed and well-nourished.  HENT:  Head: Normocephalic and atraumatic.  Right Ear: Hearing, tympanic membrane, external ear and ear canal normal.  Left  Ear: Hearing, tympanic membrane, external ear and ear canal normal.  Nose: Nose normal.  Mouth/Throat: Oropharynx is clear and moist.  Eyes: Conjunctivae and EOM are normal. Pupils are equal, round, and reactive to light.  Neck: Normal range of motion. Neck supple. No JVD present. No thyromegaly present.  Cardiovascular: Normal rate, regular rhythm and normal heart sounds.   Pulmonary/Chest: Effort normal and breath sounds normal.  Abdominal: Soft. He exhibits no distension. There is no tenderness.  Musculoskeletal: Normal range of motion.  Lymphadenopathy:    He has no cervical  adenopathy.  Neurological: He is alert and oriented to person, place, and time. He displays normal reflexes. No cranial nerve deficit or sensory deficit. He exhibits normal muscle tone. Coordination normal.  Skin: Skin is warm and dry. Capillary refill takes less than 2 seconds.  Psychiatric: He has a normal mood and affect. His behavior is normal.  Vitals reviewed.    ASSESSMENT & PLAN: Jesse Carney was seen today for head injury.  Diagnoses and all orders for this visit:  Injury of head, initial encounter -     CT Head Wo Contrast; Future -     Ambulatory referral to Neurology  Acute post-traumatic headache, not intractable -     Ambulatory referral to Neurology    Patient Instructions       IF you received an x-ray today, you will receive an invoice from [facility]. Please contact [facility] at [phone] with questions or concerns regarding your invoice.   IF you received labwork today, you will receive an invoice from [place] [facility]. Please contact [facility] at [phone] with questions or concerns regarding your invoice.   Our billing staff will not be able to assist you with questions regarding bills from these companies.  You will be contacted with the lab results as soon as they are available. The fastest way to get your results is to activate your My Chart account. Instructions are located on the last page of this paperwork. If you have not heard from [place] regarding the results in 2 weeks, please contact this office.     Head Injury, Adult There are many types of head injuries. They can be as minor as a bump. Some head injuries can be worse. Worse injuries include:  A strong hit to the head that hurts the brain (concussion).  A bruise of the brain (contusion). This means there is bleeding in the brain that can cause swelling.  A cracked skull (skull fracture).  Bleeding in the brain that gathers, gets thick (makes a clot), and forms a bump  (hematoma). Most problems from a head injury come in the first 24 hours. However, you may still have side effects up to 7-10 days after your injury. It is important to watch your condition for any changes. Follow these instructions at home: Activity  Rest as much as possible.  Avoid activities that are hard or tiring.  Make sure you get enough sleep.  Limit activities that need a lot of thought or attention, such as:  Watching TV.  Playing memory games and puzzles.  Job-related work or homework.  Working on [employer], and texting.  Avoid activities that could cause another head injury until your doctor says it is okay. This includes playing sports.  Ask your doctor when it is safe for you to go back to your normal activities, such as work or school. Ask your doctor for a step-by-step plan for slowly going back to your normal activities.  Ask your doctor when you can drive, ride a bicycle, or use heavy machinery. Never do these activities if you are dizzy. Lifestyle  Do not drink alcohol until your doctor says it is okay.  Avoid drug use.  If it is harder than usual to remember things, write them down.  If you are easily distracted, try to do one thing at a time.  Talk with family members or close friends when making important decisions.  Tell your friends, family, a trusted coworker, and work Production designer, theatre/television/film about your injury, symptoms, and limits (restrictions). Have them watch for any problems that are new or getting worse. General instructions  Take over-the-counter and prescription medicines only as told by your doctor.  Have someone stay with you for 24 hours after your head injury. This person should watch you for any changes in your symptoms and be ready to get help.  Keep all follow-up visits as told by your doctor. This is important. Prevention  Work on Therapist, music. This can help you avoid falls.  Wear a seatbelt when you are in a  moving vehicle.  Wear a helmet when:  Riding a bicycle.  Skiing.  Doing any other sport or activity that has a risk of injury.  Drink alcohol only in moderation.  Make your home safer by:  Getting rid of clutter from the floors and stairs, like things that can make you trip.  Using grab bars in bathrooms and handrails by stairs.  Placing non-slip mats on floors and in bathtubs.  Putting more light in dim areas. Get help right away if:  You have:  A very bad (severe) headache that is not helped by medicine.  Trouble walking or weakness in your arms and legs.  Clear or bloody fluid coming from your nose or ears.  Changes in your seeing (vision).  Jerky movements that you cannot control (seizure).  You throw up (vomit).  Your symptoms get worse.  You lose balance.  Your speech is slurred.  You pass out.  You are sleepier and have trouble staying awake.  The black centers of your eyes (pupils) change in size. These symptoms may be an emergency. Do not wait to see if the symptoms will go away. Get medical help right away. Call your local emergency services (911 in the [place] Do not drive yourself to the hospital.  This information is not intended to replace advice given to you by your health care provider. Make sure you discuss any questions you have with your health care provider. Document Released: 02/24/2008 Document Revised: 10/07/2015 Document Reviewed: 09/21/2015 Elsevier Interactive Patient Education  2017 Elsevier Inc.      Edwina Barth, MD [facility]

## 2016-05-22 NOTE — Telephone Encounter (Signed)
Pt has ct scheduled at 4:40 at [address]

## 2016-05-24 NOTE — Progress Notes (Signed)
Take Tylenol and/or Advil as needed and f/u with Neurology as requested.

## 2016-05-31 ENCOUNTER — Encounter: Payer: Self-pay | Admitting: Emergency Medicine

## 2016-07-20 ENCOUNTER — Telehealth: Payer: Self-pay | Admitting: Emergency Medicine

## 2016-07-20 ENCOUNTER — Ambulatory Visit: Payer: Self-pay | Admitting: Neurology

## 2016-07-20 NOTE — Telephone Encounter (Signed)
Pt requesting referral for neurology at [facility]. Please advise. Best pt callback number is [phone].

## 2016-07-20 NOTE — Telephone Encounter (Signed)
Patient was scheduled to be seen today at 7:45 am with Dr. Everlena Cooper. He did not show. The patient made the appointment when he was contacted on 05/22/2016, 2:46 pm. Confirmation/reminder messages left for him 07/17/2016 and 07/19/2016.  Spoke with patient. He reports that he did not know about the appointment this morning. He has not received the messages left reminding him of the appointment.  Gave patient the number for [facility] to reschedule his visit and check to verify that they have the correct contact number for him.

## 2016-07-20 NOTE — Telephone Encounter (Signed)
Thank you :)

## 2016-07-20 NOTE — Telephone Encounter (Signed)
He was referred to Neurology. What needs changing?

## 2016-09-25 ENCOUNTER — Telehealth: Payer: Self-pay | Admitting: Emergency Medicine

## 2016-09-25 NOTE — Telephone Encounter (Signed)
Pt called wanting to know where his MRI was supposed to be scheduled for this month. I looked in pt's chart and did not see an MRI ordered from us. I told the pt this and he said he knew he had an appt in July somewhere and thought it was for an MRI. He does have an appt scheduled for [facility] on 09/29/16 and said this may be what he meant and he just thought it was an MRI appt. I told him I would put a message in just to confirm that there wasn't supposed to be an MRI done.

## 2016-09-26 NOTE — Telephone Encounter (Signed)
Referral was placed back in February. Please check on that.

## 2016-09-26 NOTE — Telephone Encounter (Signed)
Dr. Alvy BimlerSagardia please see this.

## 2016-09-26 NOTE — Telephone Encounter (Signed)
Jesse Carney idk how you want to handle this

## 2016-09-26 NOTE — Telephone Encounter (Signed)
A referral for a CT and for neurology was placed in February on 05/22/16. Pt had CT done and is going to neurology appt on 09/29/16 but I do not see an MRI order.

## 2016-09-26 NOTE — Telephone Encounter (Signed)
Is this what he may be talking about ?

## 2016-09-26 NOTE — Telephone Encounter (Signed)
Please place referral if haven't done so already. Thanks

## 2016-09-26 NOTE — Telephone Encounter (Signed)
No MRI was scheduled. He had CT brain done. Needs Neuro consult.

## 2016-09-26 NOTE — Telephone Encounter (Signed)
MRI was never ordered and at this time should be the Neurologist's decision.

## 2016-09-28 NOTE — Telephone Encounter (Signed)
Pt has appt tomorrow with Neurologist and if they end up wanting to order an MRI their office should handle this. Thanks!

## 2016-09-28 NOTE — Telephone Encounter (Signed)
Left message informing pt that his appt for tomorrow is for neuro - they will decide of further diagnostic testing.

## 2016-09-29 ENCOUNTER — Ambulatory Visit: Payer: Managed Care, Other (non HMO) | Admitting: Neurology

## 2016-10-17 ENCOUNTER — Ambulatory Visit: Payer: Managed Care, Other (non HMO) | Admitting: Neurology

## 2016-12-01 ENCOUNTER — Emergency Department (HOSPITAL_COMMUNITY): Payer: No Typology Code available for payment source

## 2016-12-01 ENCOUNTER — Encounter (HOSPITAL_COMMUNITY): Payer: Self-pay | Admitting: Emergency Medicine

## 2016-12-01 ENCOUNTER — Emergency Department (HOSPITAL_COMMUNITY)
Admission: EM | Admit: 2016-12-01 | Discharge: 2016-12-01 | Disposition: A | Discharge status: Home / Self Care | Payer: No Typology Code available for payment source | Attending: Emergency Medicine | Admitting: Emergency Medicine

## 2016-12-01 DIAGNOSIS — G44209 Tension-type headache, unspecified, not intractable: Secondary | ICD-10-CM | POA: Diagnosis not present

## 2016-12-01 DIAGNOSIS — Y9389 Activity, other specified: Secondary | ICD-10-CM | POA: Insufficient documentation

## 2016-12-01 DIAGNOSIS — Y92410 Unspecified street and highway as the place of occurrence of the external cause: Secondary | ICD-10-CM | POA: Diagnosis not present

## 2016-12-01 DIAGNOSIS — M62838 Other muscle spasm: Secondary | ICD-10-CM

## 2016-12-01 DIAGNOSIS — M542 Cervicalgia: Secondary | ICD-10-CM | POA: Diagnosis present

## 2016-12-01 DIAGNOSIS — Y999 Unspecified external cause status: Secondary | ICD-10-CM | POA: Diagnosis not present

## 2016-12-01 MED ORDER — CYCLOBENZAPRINE HCL 10 MG PO TABS: 10.0000 mg | ORAL_TABLET | Freq: Every evening | ORAL | 0 refills | Status: DC | PRN

## 2016-12-01 MED ORDER — CYCLOBENZAPRINE HCL 10 MG PO TABS
10.0000 mg | ORAL_TABLET | Freq: Once | ORAL | Status: DC
Start: 2016-12-01 — End: 2016-12-01
  Filled 2016-12-01: qty 1

## 2016-12-01 MED ORDER — HYDROCODONE-ACETAMINOPHEN 5-325 MG PO TABS
1.0000 | ORAL_TABLET | Freq: Once | ORAL | Status: AC
  Administered 2016-12-01: 1 via ORAL
  Filled 2016-12-01: qty 1

## 2016-12-01 MED ORDER — HYDROCODONE-ACETAMINOPHEN 5-325 MG PO TABS: 1.0000 | ORAL_TABLET | Freq: Four times a day (QID) | ORAL | 0 refills | Status: DC | PRN

## 2016-12-01 MED ORDER — CYCLOBENZAPRINE HCL 10 MG PO TABS
10.0000 mg | ORAL_TABLET | Freq: Once | ORAL | Status: AC
  Administered 2016-12-01: 10 mg via ORAL
  Filled 2016-12-01: qty 1
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
## 2016-12-01 NOTE — Discharge Instructions (Signed)
Please take Tylenol as needed for ear pain. You may take up to 1000 mg (2 extra strength Tylenol pills) every 8 hours as needed for pain. Do not take more than 3,500 mg of tylenol in a 24-hour period. Please be advised that your prescription pain medication contains Tylenol and you need to include that in your daily allowance of Tylenol.  Today you received medications that may make you sleepy or impair your ability to make decisions.  For the next 24 hours please do not drive, operate heavy machinery, care for a small child with out another adult present, or perform any activities that may cause harm to you or someone else if you were to fall asleep or be impaired.   You are being prescribed a medication which may make you sleepy. Please follow up of listed precautions for at least 24 hours after taking one dose.
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
## 2017-01-29 ENCOUNTER — Other Ambulatory Visit: Payer: 59

## 2017-01-29 ENCOUNTER — Encounter: Payer: Self-pay | Admitting: Neurology

## 2017-01-29 ENCOUNTER — Ambulatory Visit: Payer: 59 | Admitting: Neurology

## 2017-01-29 VITALS — BP 122/78 | HR 70 | Ht 72.0 in | Wt 170.2 lb

## 2017-01-29 DIAGNOSIS — Z79899 Other long term (current) drug therapy: Secondary | ICD-10-CM | POA: Diagnosis not present

## 2017-01-29 DIAGNOSIS — G500 Trigeminal neuralgia: Secondary | ICD-10-CM | POA: Diagnosis not present

## 2017-01-29 LAB — BASIC METABOLIC PANEL
BUN: 14 mg/dL (ref 7–25)
CALCIUM: 9.8 mg/dL (ref 8.6–10.3)
CO2: 30 mmol/L (ref 20–32)
Chloride: 103 mmol/L (ref 98–110)
Creat: 0.94 mg/dL (ref 0.60–1.35)
Glucose, Bld: 84 mg/dL (ref 65–99)
Potassium: 4.7 mmol/L (ref 3.5–5.3)
Sodium: 139 mmol/L (ref 135–146)

## 2017-01-29 MED ORDER — OXCARBAZEPINE 150 MG PO TABS: 150.0000 mg | ORAL_TABLET | Freq: Two times a day (BID) | ORAL | 2 refills | Status: DC

## 2017-01-29 NOTE — Progress Notes (Signed)
NEUROLOGY CONSULTATION NOTE  Jesse Carney MRN: [id number] DOB: 1980-12-14  Referring provider: Georgina QuintMiguel Jose Sagardia, MD Primary care provider: Georgina QuintMiguel Jose Sagardia, MD   Reason for consult:  headache  HISTORY OF PRESENT ILLNESS: Jesse Carney is a 36 year old male who presents for headache.  History supplemented by PCP note.  In February 2018, he was walking up stairs when he hit the top of his head on the overhead.  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  About 3 days later, he developed severe paroxysmal shooting pain radiating from the top of his head down to the left eye, cheek and left upper tooth.  Paroxysms may last a few minutes and occur daily throughout the day.  There are no associated symptoms such as dizziness, facial numbness or facial weakness.  Eating, talking and brushing his teeth may trigger or exacerbate the pain.  Nothing relieves it.  He has tried cyclobenzaprine and Tylenol, which were ineffective.  He denies prior history of similar symptoms.  PAST MEDICAL HISTORY: History reviewed. No pertinent past medical history.  PAST SURGICAL HISTORY: Past Surgical History:  Procedure Laterality Date  . bone correction    . WISDOM TOOTH EXTRACTION      MEDICATIONS: Current Outpatient Medications on File Prior to Visit  Medication Sig Dispense Refill  . finasteride (PROSCAR) 5 MG tablet Take 5 mg daily by mouth.    . cyclobenzaprine (FLEXERIL) 10 MG tablet Take 1 tablet (10 mg total) by mouth at bedtime as needed for muscle spasms. (Patient not taking: Reported on 01/29/2017) 10 tablet 0  . HYDROcodone-acetaminophen (NORCO/VICODIN) 5-325 MG tablet Take 1 tablet by mouth every 6 (six) hours as needed for severe pain. (Patient not taking: Reported on 01/29/2017) 10 tablet 0  . methocarbamol (ROBAXIN) 500 MG tablet Take 1 tablet  (500 mg total) by mouth 2 (two) times daily as needed. (Patient not taking: Reported on 08/11/2014) 30 tablet 0  . traMADol (ULTRAM) 50 MG tablet Take 1 tablet (50 mg total) by mouth every 8 (eight) hours as needed for pain. (Patient not taking: Reported on 08/11/2014) 30 tablet 0   No current facility-administered medications on file prior to visit.     ALLERGIES: Allergies  Allergen Reactions  . Ibuprofen Swelling  . Nsaids Swelling    Allergic to any anti-inflammatories    FAMILY HISTORY: Family History  Problem Relation Age of Onset  . Hypertension Mother   . Hypertension Father     SOCIAL HISTORY: Social History   Socioeconomic History  . Marital status: Married    Spouse name: Not on file  . Number of children: 2  . Years of education: Not on file  . Highest education level: Bachelor's degree (e.g., BA, AB, BS)  Social Needs  . Financial resource strain: Not on file  . Food insecurity - worry: Not on file  . Food insecurity - inability: Not on file  . Transportation needs - medical: Not on file  . Transportation needs - non-medical: Not on file  Occupational History  . Not on file  Tobacco Use  . Smoking status: Never Smoker  . Smokeless tobacco: Never Used  Substance and Sexual Activity  . Alcohol use: No  . Drug use: No  . Sexual activity: No  Other Topics Concern  . Not on file  Social History Narrative   Married with 2  children, avoids caffeine. Exercises 3-4 times a week at the gym. Works in Consulting civil engineer, also is Charity fundraiser.    REVIEW OF SYSTEMS: Constitutional: No fevers, chills, or sweats, no generalized fatigue, change in appetite Eyes: No visual changes, double vision, eye pain Ear, nose and throat: No hearing loss, ear pain, nasal congestion, sore throat Cardiovascular: No chest pain, palpitations Respiratory:  No shortness of breath at rest or with exertion, wheezes GastrointestinaI: No nausea, vomiting, diarrhea, abdominal pain, fecal  incontinence Genitourinary:  No dysuria, urinary retention or frequency Musculoskeletal:  No neck pain, back pain Integumentary: No rash, pruritus, skin lesions Neurological: as above Psychiatric: No depression, insomnia, anxiety Endocrine: No palpitations, fatigue, diaphoresis, mood swings, change in appetite, change in weight, increased thirst Hematologic/Lymphatic:  No purpura, petechiae. Allergic/Immunologic: no itchy/runny eyes, nasal congestion, recent allergic reactions, rashes  PHYSICAL EXAM: Vitals:   01/29/17 0811  BP: 122/78  Pulse: 70  SpO2: 98%   General: No acute distress.  Patient appears well-groomed.  Head:  Normocephalic/atraumatic Eyes:  fundi examined but not visualized Neck: supple, no paraspinal tenderness, full range of motion Back: No paraspinal tenderness Heart: regular rate and rhythm Lungs: Clear to auscultation bilaterally. Vascular: No carotid bruits. Neurological Exam: Mental status: alert and oriented to person, place, and time, recent and remote memory intact, fund of knowledge intact, attention and concentration intact, speech fluent and not dysarthric, language intact. Cranial nerves: CN I: not tested CN II: pupils equal, round and reactive to light, visual fields intact CN III, IV, VI:  full range of motion, no nystagmus, no ptosis CN V: facial sensation intact CN VII: upper and lower face symmetric CN VIII: hearing intact CN IX, X: gag intact, uvula midline CN XI: sternocleidomastoid and trapezius muscles intact CN XII: tongue midline Bulk & Tone: normal, no fasciculations. Motor:  5/5 throughout  Sensation: temperature and vibration sensation intact. Deep Tendon Reflexes:  2+ throughout, toes downgoing.  Finger to nose testing:  Without dysmetria.  Heel to shin:  Without dysmetria.  Gait:  Normal station and stride.  Able to turn and tandem walk. Romberg negative.  IMPRESSION: Left sided trigeminal neuralgia, atypical  presentation  PLAN: 1.  Start oxcarbazepine 150mg  twice daily.  Check baseline Na level today and again in 3 months prior to follow up.  Advised to monitor side effects such as dizziness. 2.  Exam is normal and without any other abnormal symptoms.  Therefore, we will hold off on imaging.  If medication not helpful in 3 months, we will consider MRI/MRA of head and trigeminal nerve. 3.  Follow up in 3 months.  Thank you for allowing me to take part in the care of this patient.  Shon Millet, DO  CC:  Georgina Quint, MD

## 2017-01-29 NOTE — Patient Instructions (Signed)
1.  Start oxcarbazepine 150mg  twice daily.  Side effects may include dizziness.  We will check baseline basic metabolic panel. 2.  Contact me in 6 weeks with update and we can increase dose if needed. 3.  Repeat basic metabolic panel prior to follow up.   Trigeminal Neuralgia Trigeminal neuralgia is a nerve disorder that causes attacks of severe facial pain. The attacks last from a few seconds to several minutes. They can happen for days, weeks, or months and then go away for months or years. Trigeminal neuralgia is also called tic douloureux. What are the causes? This condition is caused by damage to a nerve in the face that is called the trigeminal nerve. An attack can be triggered by:  Talking.  Chewing.  Putting on makeup.  Washing your face.  Shaving your face.  Brushing your teeth.  Touching your face.  What increases the risk? This condition is more likely to develop in:  Women.  People who are 38 years of age or older.  What are the signs or symptoms? The main symptom of this condition is pain in the jaw, lips, eyes, nose, scalp, forehead, and face. The pain may be intense, stabbing, electric, or shock-like. How is this diagnosed? This condition is diagnosed with a physical exam. A CT scan or MRI may be done to rule out other conditions that can cause facial pain. How is this treated? This condition may be treated with:  Avoiding the things that trigger your attacks.  Pain medicine.  Surgery. This may be done in severe cases if other medical treatment does not provide relief.  Follow these instructions at home:  Take over-the-counter and prescription medicines only as told by your health care provider.  If you wish to get pregnant, talk with your health care provider before you start trying to get pregnant.  Avoid the things that trigger your attacks. It may help to: ? Chew on the unaffected side of your mouth. ? Avoid touching your face. ? Avoid blasts of  hot or cold air. Contact a health care provider if:  Your pain medicine is not helping.  You develop new, unexplained symptoms, such as: ? Double vision. ? Facial weakness. ? Changes in hearing or balance.  You become pregnant. Get help right away if:  Your pain is unbearable, and your pain medicine does not help. This information is not intended to replace advice given to you by your health care provider. Make sure you discuss any questions you have with your health care provider. Document Released: 03/10/2000 Document Revised: 11/14/2015 Document Reviewed: 07/06/2014 Elsevier Interactive Patient Education  [employer]2018 [employer]

## 2017-02-19 NOTE — ED Notes (Signed)
Patient needed a work note 

## 2017-02-20 ENCOUNTER — Telehealth: Payer: Self-pay | Admitting: Neurology

## 2017-02-20 NOTE — Telephone Encounter (Signed)
Called and spoke with Pt, he said he has been taking the oxcarbazepine since 02/01/17, has seen no difference and at times the pain is unbearable.Should he increase or try something else?

## 2017-02-20 NOTE — Telephone Encounter (Signed)
Increase oxcarbazepine to 300mg  twice daily

## 2017-02-20 NOTE — Telephone Encounter (Signed)
Patient called to say that the medication Oxcarbazepine is not seeming to help. Please Call. Thanks

## 2017-02-21 NOTE — Telephone Encounter (Signed)
LM on VM advising Pt of increase, and to call me to let me know he rcvd the message.

## 2017-02-21 NOTE — Telephone Encounter (Signed)
Pt rtrnd call, went over increase with him to ensure he was taking the correct amount. Pt verbalized understanding

## 2017-04-28 ENCOUNTER — Other Ambulatory Visit: Payer: Self-pay | Admitting: Neurology

## 2017-05-08 ENCOUNTER — Ambulatory Visit: Payer: 59 | Admitting: Neurology

## 2017-06-06 ENCOUNTER — Other Ambulatory Visit: Payer: Self-pay | Admitting: Neurology

## 2017-08-14 ENCOUNTER — Ambulatory Visit (INDEPENDENT_AMBULATORY_CARE_PROVIDER_SITE_OTHER): Payer: 59 | Admitting: Neurology

## 2017-08-14 ENCOUNTER — Other Ambulatory Visit: Payer: 59

## 2017-08-14 ENCOUNTER — Other Ambulatory Visit: Payer: Self-pay | Admitting: *Deleted

## 2017-08-14 ENCOUNTER — Encounter: Payer: Self-pay | Admitting: Neurology

## 2017-08-14 VITALS — BP 120/84 | HR 65 | Ht 72.0 in | Wt 170.2 lb

## 2017-08-14 DIAGNOSIS — G500 Trigeminal neuralgia: Secondary | ICD-10-CM | POA: Diagnosis not present

## 2017-08-14 DIAGNOSIS — Z79899 Other long term (current) drug therapy: Secondary | ICD-10-CM

## 2017-08-14 LAB — BASIC METABOLIC PANEL
BUN: 16 mg/dL (ref 7–25)
CALCIUM: 9.6 mg/dL (ref 8.6–10.3)
CO2: 27 mmol/L (ref 20–32)
Chloride: 104 mmol/L (ref 98–110)
Creat: 0.92 mg/dL (ref 0.60–1.35)
Glucose, Bld: 97 mg/dL (ref 65–99)
POTASSIUM: 4.3 mmol/L (ref 3.5–5.3)
Sodium: 138 mmol/L (ref 135–146)

## 2017-08-14 NOTE — Patient Instructions (Addendum)
1.  Continue oxcarbazepine  twice daily.  If the pain does not get better or if it gets worse, contact me and we will increase dose. 2.  We will check a routine basic metabolic panel (I am monitoring your sodium level) 3.  Follow up in 5 months.

## 2017-08-14 NOTE — Progress Notes (Signed)
NEUROLOGY FOLLOW UP OFFICE NOTE  Jesse Carney [id number]  HISTORY OF PRESENT ILLNESS: Jesse Carney is a 37 year old male who follows up for left sided trigeminal neuralgia.    UPDATE: He is taking oxcarbazepine  twice daily.   Overall, attacks have been well controlled.  It would occur once in awhile.  However, they have been more frequent this past week, occurring daily.  They are now brief (only a second) and not as severe.  They are triggered when chewing.  They are brief so nothing specifically relieves it (it aborts spontaneously).  01/29/17:  Na 139   HISTORY: In February 2018, he was walking up stairs when he hit the top of his head on the overhead.  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  About 3 days later, he developed severe paroxysmal shooting pain radiating from the top of his head down to the left eye, cheek and left upper tooth.  Paroxysms may last a few minutes and occur daily throughout the day.  There are no associated symptoms such as dizziness, facial numbness or facial weakness.  Eating, talking and brushing his teeth may trigger or exacerbate the pain.  Nothing relieves it.  He has tried cyclobenzaprine and Tylenol, which were ineffective.  He denies prior history of similar symptoms.  PAST MEDICAL HISTORY: History reviewed. No pertinent past medical history.  MEDICATIONS: Current Outpatient Medications on File Prior to Visit  Medication Sig Dispense Refill  . cyclobenzaprine (FLEXERIL) 10 MG tablet Take 1 tablet (10 mg total) by mouth at bedtime as needed for muscle spasms. (Patient not taking: Reported on 01/29/2017) 10 tablet 0  . finasteride (PROSCAR) 5 MG tablet Take 5 mg daily by mouth.    Marland Kitchen HYDROcodone-acetaminophen (NORCO/VICODIN) 5-325 MG tablet Take 1 tablet by mouth every 6 (six) hours as needed for severe  pain. (Patient not taking: Reported on 01/29/2017) 10 tablet 0  . methocarbamol (ROBAXIN) 500 MG tablet Take 1 tablet (500 mg total) by mouth 2 (two) times daily as needed. (Patient not taking: Reported on 08/11/2014) 30 tablet 0  . OXcarbazepine (TRILEPTAL) 150 MG tablet TAKE 2 TABLETS BY MOUTH 2 TIMES DAILY. 120 tablet 2  . traMADol (ULTRAM) 50 MG tablet Take 1 tablet (50 mg total) by mouth every 8 (eight) hours as needed for pain. (Patient not taking: Reported on 08/11/2014) 30 tablet 0   No current facility-administered medications on file prior to visit.     ALLERGIES: Allergies  Allergen Reactions  . Ibuprofen Swelling  . Nsaids Swelling    Allergic to any anti-inflammatories    FAMILY HISTORY: Family History  Problem Relation Age of Onset  . Hypertension Mother   . Hypertension Father     SOCIAL HISTORY: Social History   Socioeconomic History  . Marital status: Married    Spouse name: Not on file  . Number of children: 2  . Years of education: Not on file  . Highest education level: Bachelor's degree (e.g., BA, AB, BS)  Occupational History  . Not on file  Social Needs  . Financial resource strain: Not on file  . Food insecurity:    Worry: Not on file    Inability: Not on file  . Transportation needs:    Medical: Not on file    Non-medical: Not on file  Tobacco Use  . Smoking status: Never Smoker  .  Smokeless tobacco: Never Used  Substance and Sexual Activity  . Alcohol use: No  . Drug use: No  . Sexual activity: Never  Lifestyle  . Physical activity:    Days per week: 4 days    Minutes per session: 60 min  . Stress: Not on file  Relationships  . Social connections:    Talks on phone: Not on file    Gets together: Not on file    Attends religious service: Not on file    Active member of club or organization: Not on file    Attends meetings of clubs or organizations: Not on file    Relationship status: Not on file  . Intimate partner violence:    Fear  of current or ex partner: Not on file    Emotionally abused: Not on file    Physically abused: Not on file    Forced sexual activity: Not on file  Other Topics Concern  . Not on file  Social History Narrative   Married with 2 children, avoids caffeine. Exercises 3-4 times a week at the gym. Works in Consulting civil engineer, also is Char[employer] fundraiser.    REVIEW OF SYSTEMS: Constitutional: No fevers, chills, or sweats, no generalized fatigue, change in appetite Eyes: No visual changes, double vision, eye pain Ear, nose and throat: No hearing loss, ear pain, nasal congestion, sore throat Cardiovascular: No chest pain, palpitations Respiratory:  No shortness of breath at rest or with exertion, wheezes GastrointestinaI: No nausea, vomiting, diarrhea, abdominal pain, fecal incontinence Genitourinary:  No dysuria, urinary retention or frequency Musculoskeletal:  No neck pain, back pain Integumentary: No rash, pruritus, skin lesions Neurological: as above Psychiatric: No depression, insomnia, anxiety Endocrine: No palpitations, fatigue, diaphoresis, mood swings, change in appetite, change in weight, increased thirst Hematologic/Lymphatic:  No purpura, petechiae. Allergic/Immunologic: no itchy/runny eyes, nasal congestion, recent allergic reactions, rashes  PHYSICAL EXAM: Vitals:   08/14/17 1420  BP: 120/84  Pulse: 65  SpO2: 96%   General: No acute distress.  Patient appears well-groomed.  normal body habitus. Head:  Normocephalic/atraumatic Eyes:  Fundi examined but not visualized Neck: supple, no paraspinal tenderness, full range of motion Heart:  Regular rate and rhythm Lungs:  Clear to auscultation bilaterally Back: No paraspinal tenderness Neurological Exam: alert and oriented to person, place, and time. Attention span and concentration intact, recent and remote memory intact, fund of knowledge intact.  Speech fluent and not dysarthric, language intact.  Altered sensation in left V2 to touch.   Otherwise, CN II-XII intact. Bulk and tone normal, muscle strength 5/5 throughout.  Sensation to light touch, temperature and vibration intact.  Deep tendon reflexes 2+ throughout, toes downgoing.  Finger to nose and heel to shin testing intact.  Gait normal, Romberg negative.  IMPRESSION: Left sided trigeminal neuralgia  PLAN: 1.  At this point, he does not want to change current dose of oxcarbazepine.  If attacks don't improve or if they get worse, he will contact me and we can increase dose. 2.  Check routine BMP to evaluate Na level. 3.  Follow up in 5 months.  Shon Millet, DO

## 2017-08-15 ENCOUNTER — Telehealth: Payer: Self-pay

## 2017-08-15 NOTE — Telephone Encounter (Signed)
Called Pt, LMOVM advising labs are normal 

## 2017-08-15 NOTE — Telephone Encounter (Signed)
-----   Message from Drema Dallas, DO sent at 08/15/2017  7:54 AM EDT ----- Labs normal

## 2017-08-28 ENCOUNTER — Encounter: Payer: Self-pay | Admitting: Neurology

## 2017-09-13 ENCOUNTER — Encounter: Payer: Self-pay | Admitting: Neurology

## 2017-10-18 ENCOUNTER — Other Ambulatory Visit: Payer: Self-pay | Admitting: Neurology

## 2018-01-15 ENCOUNTER — Ambulatory Visit: Payer: 59 | Admitting: Neurology

## 2018-02-15 NOTE — Progress Notes (Signed)
NEUROLOGY FOLLOW UP OFFICE NOTE  Jesse Carney [id number]  HISTORY OF PRESENT ILLNESS: Jesse Carney is a 37 year old male who follows up for left-sided trigeminal neuralgia.  UPDATE:  Since last visit in May, he takes oxcarbazepine as needed (usually 1 or 2 pills a day).  He has been seeing a chiropractor which has helped.  Facial pain has been controlled (no major flare-ups since last Thanksgiving).  Duration and frequency not applicable. Sodium from 08/14/2017 was 138.  HISTORY:  In February 2018, he was walking upstairs when he hit the top of his head on the overhead.  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  About 3 days later, he developed severe paroxysmal shooting pain radiating from the top of his head down to the left eye, cheek, and left upper tooth.  Paroxysms would last a few minutes and occur daily throughout the day.  There are no associated symptoms such as dizziness, facial numbness, or facial weakness.  Eating, talking, and brushing his teeth may trigger or exacerbate the pain.  Nothing relieved it.  He tried cyclobenzaprine and Tylenol, which were ineffective.  He denied prior history of similar symptoms.  PAST MEDICAL HISTORY: No past medical history on file.  MEDICATIONS: Current Outpatient Medications on File Prior to Visit  Medication Sig Dispense Refill  . cyclobenzaprine (FLEXERIL) 10 MG tablet Take 1 tablet (10 mg total) by mouth at bedtime as needed for muscle spasms. (Patient not taking: Reported on 01/29/2017) 10 tablet 0  . finasteride (PROSCAR) 5 MG tablet Take 5 mg daily by mouth.    Marland Kitchen HYDROcodone-acetaminophen (NORCO/VICODIN) 5-325 MG tablet Take 1 tablet by mouth every 6 (six) hours as needed for severe pain. (Patient not taking: Reported on 01/29/2017) 10 tablet 0  . methocarbamol (ROBAXIN) 500 MG tablet Take 1 tablet (500 mg total) by mouth 2  (two) times daily as needed. (Patient not taking: Reported on 08/11/2014) 30 tablet 0  . OXcarbazepine (TRILEPTAL) 150 MG tablet TAKE 2 TABLETS BY MOUTH 2 TIMES DAILY. 120 tablet 5  . traMADol (ULTRAM) 50 MG tablet Take 1 tablet (50 mg total) by mouth every 8 (eight) hours as needed for pain. (Patient not taking: Reported on 08/11/2014) 30 tablet 0   No current facility-administered medications on file prior to visit.     ALLERGIES: Allergies  Allergen Reactions  . Ibuprofen Swelling  . Nsaids Swelling    Allergic to any anti-inflammatories    FAMILY HISTORY: Family History  Problem Relation Age of Onset  . Hypertension Mother   . Hypertension Father    SOCIAL HISTORY: Social History   Socioeconomic History  . Marital status: Married    Spouse name: Not on file  . Number of children: 2  . Years of education: Not on file  . Highest education level: Bachelor's degree (e.g., BA, AB, BS)  Occupational History  . Not on file  Social Needs  . Financial resource strain: Not on file  . Food insecurity:    Worry: Not on file    Inability: Not on file  . Transportation needs:    Medical: Not on file    Non-medical: Not on file  Tobacco Use  . Smoking status: Never Smoker  . Smokeless tobacco: Never Used  Substance and Sexual Activity  . Alcohol use: No  . Drug use: No  . Sexual activity: Never  Lifestyle  . Physical activity:  Days per week: 4 days    Minutes per session: 60 min  . Stress: Not on file  Relationships  . Social connections:    Talks on phone: Not on file    Gets together: Not on file    Attends religious service: Not on file    Active member of club or organization: Not on file    Attends meetings of clubs or organizations: Not on file    Relationship status: Not on file  . Intimate partner violence:    Fear of current or ex partner: Not on file    Emotionally abused: Not on file    Physically abused: Not on file    Forced sexual activity: Not on  file  Other Topics Concern  . Not on file  Social History Narrative   Married with 2 children, avoids caffeine. Exercises 3-4 times a week at the gym. Works in Consulting civil engineerT, also is Charity fundraiseracting and modeling.    REVIEW OF SYSTEMS: Constitutional: No fevers, chills, or sweats, no generalized fatigue, change in appetite Eyes: No visual changes, double vision, eye pain Ear, nose and throat: No hearing loss, ear pain, nasal congestion, sore throat Cardiovascular: No chest pain, palpitations Respiratory:  No shortness of breath at rest or with exertion, wheezes GastrointestinaI: No nausea, vomiting, diarrhea, abdominal pain, fecal incontinence Genitourinary:  No dysuria, urinary retention or frequency Musculoskeletal:  No neck pain, back pain Integumentary: No rash, pruritus, skin lesions Neurological: as above Psychiatric: No depression, insomnia, anxiety Endocrine: No palpitations, fatigue, diaphoresis, mood swings, change in appetite, change in weight, increased thirst Hematologic/Lymphatic:  No purpura, petechiae. Allergic/Immunologic: no itchy/runny eyes, nasal congestion, recent allergic reactions, rashes  PHYSICAL EXAM: Blood pressure 108/74, pulse 65, height 6' (1.829 m), weight 170 lb (77.1 kg), SpO2 98 %. General: No acute distress.  Patient appears well-groomed.  normal body habitus. Head:  Normocephalic/atraumatic Eyes:  Fundi examined but not visualized Neck: supple, no paraspinal tenderness, full range of motion Heart:  Regular rate and rhythm Lungs:  Clear to auscultation bilaterally Back: No paraspinal tenderness Neurological Exam: alert and oriented to person, place, and time. Attention span and concentration intact, recent and remote memory intact, fund of knowledge intact.  Speech fluent and not dysarthric, language intact.  Altered sensation in left V2 distribution to touch.  Otherwise, CN II-XII intact. Bulk and tone normal, muscle strength 5/5 throughout.  Sensation to light touch  intact.  Deep tendon reflexes 2+ throughout. Finger to nose testing intact.  Gait normal, Romberg negative.  IMPRESSION: Left-sided trigeminal neuralgia  PLAN: 1.  Continue oxcarbazepine as needed.  If he has a recurrent flareup, we can titrate up to 600mg  twice daily 2.  Follow up in 6 months.  Shon MilletAdam Jaffe, DO

## 2018-02-18 ENCOUNTER — Encounter: Payer: Self-pay | Admitting: Neurology

## 2018-02-18 ENCOUNTER — Ambulatory Visit: Payer: 59 | Admitting: Neurology

## 2018-02-18 VITALS — BP 108/74 | HR 65 | Ht 72.0 in | Wt 170.0 lb

## 2018-02-18 DIAGNOSIS — G500 Trigeminal neuralgia: Secondary | ICD-10-CM | POA: Diagnosis not present

## 2018-02-18 NOTE — Patient Instructions (Signed)
Continue current regimen.  If pain flares up, contact me.

## 2018-03-06 ENCOUNTER — Other Ambulatory Visit: Payer: Self-pay | Admitting: Neurology

## 2018-08-15 ENCOUNTER — Encounter: Payer: Self-pay | Admitting: Neurology

## 2018-08-20 ENCOUNTER — Ambulatory Visit: Payer: 59 | Admitting: Neurology

## 2018-12-11 ENCOUNTER — Other Ambulatory Visit: Payer: Self-pay | Admitting: Emergency Medicine

## 2018-12-11 DIAGNOSIS — Z20822 Contact with and (suspected) exposure to covid-19: Secondary | ICD-10-CM

## 2018-12-12 LAB — NOVEL CORONAVIRUS, NAA: SARS-CoV-2, NAA: NOT DETECTED

## 2019-03-20 ENCOUNTER — Ambulatory Visit: Payer: 59 | Attending: Internal Medicine

## 2019-03-20 DIAGNOSIS — U071 COVID-19: Secondary | ICD-10-CM

## 2019-03-22 LAB — NOVEL CORONAVIRUS, NAA: SARS-CoV-2, NAA: NOT DETECTED

## 2019-04-30 ENCOUNTER — Ambulatory Visit: Payer: No Typology Code available for payment source | Attending: Internal Medicine

## 2019-04-30 DIAGNOSIS — Z20822 Contact with and (suspected) exposure to covid-19: Secondary | ICD-10-CM

## 2019-05-01 LAB — NOVEL CORONAVIRUS, NAA: SARS-CoV-2, NAA: NOT DETECTED

## 2019-10-07 ENCOUNTER — Ambulatory Visit: Payer: No Typology Code available for payment source | Admitting: Sports Medicine

## 2019-12-09 ENCOUNTER — Other Ambulatory Visit: Payer: No Typology Code available for payment source

## 2021-03-30 ENCOUNTER — Other Ambulatory Visit (INDEPENDENT_AMBULATORY_CARE_PROVIDER_SITE_OTHER): Payer: No Typology Code available for payment source

## 2021-03-30 ENCOUNTER — Ambulatory Visit (INDEPENDENT_AMBULATORY_CARE_PROVIDER_SITE_OTHER): Payer: No Typology Code available for payment source | Admitting: Neurology

## 2021-03-30 ENCOUNTER — Encounter: Payer: Self-pay | Admitting: Neurology

## 2021-03-30 ENCOUNTER — Other Ambulatory Visit: Payer: Self-pay

## 2021-03-30 VITALS — BP 123/67 | HR 63 | Ht 71.0 in | Wt 170.7 lb

## 2021-03-30 DIAGNOSIS — Z79899 Other long term (current) drug therapy: Secondary | ICD-10-CM

## 2021-03-30 DIAGNOSIS — G500 Trigeminal neuralgia: Secondary | ICD-10-CM | POA: Diagnosis not present

## 2021-03-30 LAB — BASIC METABOLIC PANEL
BUN: 21 mg/dL (ref 6–23)
CO2: 29 mEq/L (ref 19–32)
Calcium: 9.4 mg/dL (ref 8.4–10.5)
Chloride: 101 mEq/L (ref 96–112)
Creatinine, Ser: 0.89 mg/dL (ref 0.40–1.50)
GFR: 106.05 mL/min (ref >60.00)
Glucose, Bld: 79 mg/dL (ref 70–99)
Potassium: 4.4 mEq/L (ref 3.5–5.1)
Sodium: 137 mEq/L (ref 135–145)

## 2021-03-30 MED ORDER — OXCARBAZEPINE 150 MG PO TABS: 300.0000 mg | ORAL_TABLET | Freq: Two times a day (BID) | ORAL | 2 refills | Status: DC

## 2021-03-30 NOTE — Patient Instructions (Signed)
Refilled oxcarbazepine.  Prescribed for 2 tablets twice daily but may take 1 daily as needed for flare MRI of brain with and without contrast Check baseline BMP Follow up one year or as needed.

## 2021-03-30 NOTE — Progress Notes (Signed)
NEUROLOGY CONSULTATION NOTE  Jesse Carney MRN: [id number] DOB: 1980/07/10   Reason for consult:  left-sided trigeminal neuralgia  Assessment/Plan:   Left sided trigeminal neuralgia  Refilled oxcarbazepine.  If 150mg  once daily is effective, he may continue as needed.  Check BMP. I think as part of workup, he should have MRI of brain with and without contrast to evaluate for secondary intracranial etiology. Otherwise, follow up in one year.   Subjective:  Jesse Carney is a 41 year old male who follows up for left-sided trigeminal neuralgia.  HISTORY:  In February 2018, he was walking upstairs when he hit the top of his head on the overhead.  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  About 3 days later, he developed severe paroxysmal shooting pain radiating from the top of his head down to the left eye, cheek, and left upper tooth.  Paroxysms would last a few minutes and occur daily throughout the day.  There are no associated symptoms such as dizziness, facial numbness, or facial weakness.  Eating, talking, and brushing his teeth may trigger or exacerbate the pain.  Nothing relieved it.  He tried cyclobenzaprine and Tylenol, which were ineffective.  He denied prior history of similar symptoms. I initially saw him in 2019 and started him on oxcarbazepine.  He started taking oxcarbazepine only as needed.  Symptoms resolved and oxcarbazepine was discontinued later that year.  Flares are very well controlled.  If he starts to feel shocks, he will see his chiropractor.  Cervical spine adjustments tend to resolve it within 2 days.  On rare occasions, he will take oxcarbazepine for a few days if it is more severe.  He only treats with 150mg  once daily to minimize drowsiness and it seems to be effective.  They occur every few months.   PAST MEDICAL HISTORY: No past medical history  on file.  PAST SURGICAL HISTORY: Past Surgical History:  Procedure Laterality Date   bone correction     WISDOM TOOTH EXTRACTION      MEDICATIONS: Current Outpatient Medications on File Prior to Visit  Medication Sig Dispense Refill   cyclobenzaprine (FLEXERIL) 10 MG tablet Take 1 tablet (10 mg total) by mouth at bedtime as needed for muscle spasms. (Patient not taking: Reported on 01/29/2017) 10 tablet 0   finasteride (PROSCAR) 5 MG tablet Take 5 mg daily by mouth.     HYDROcodone-acetaminophen (NORCO/VICODIN) 5-325 MG tablet Take 1 tablet by mouth every 6 (six) hours as needed for severe pain. (Patient not taking: Reported on 01/29/2017) 10 tablet 0   methocarbamol (ROBAXIN) 500 MG tablet Take 1 tablet (500 mg total) by mouth 2 (two) times daily as needed. (Patient not taking: Reported on 08/11/2014) 30 tablet 0   OXcarbazepine (TRILEPTAL) 150 MG tablet TAKE 2 TABLETS BY MOUTH 2 TIMES DAILY. 360 tablet 2   traMADol (ULTRAM) 50 MG tablet Take 1 tablet (50 mg total) by mouth every 8 (eight) hours as needed for pain. (Patient not taking: Reported on 08/11/2014) 30 tablet 0   No current facility-administered medications on file prior to visit.    ALLERGIES: Allergies  Allergen Reactions   Ibuprofen Swelling   Nsaids Swelling    Allergic to any anti-inflammatories    FAMILY HISTORY: Family History  Problem Relation Age of Onset   Hypertension Mother    Hypertension Father     Objective:  Blood pressure 123/67, pulse 63, height  5\' 11"  (1.803 m), weight 170 lb 11.2 oz (77.4 kg), SpO2 99 %. General: No acute distress.  Patient appears well-groomed.   Head:  Normocephalic/atraumatic Eyes:  fundi examined but not visualized Neck: supple, no paraspinal tenderness, full range of motion Back: No paraspinal tenderness Heart: regular rate and rhythm Lungs: Clear to auscultation bilaterally. Vascular: No carotid bruits. Neurological Exam: Mental status: alert and oriented to person,  place, and time, recent and remote memory intact, fund of knowledge intact, attention and concentration intact, speech fluent and not dysarthric, language intact. Cranial nerves: CN I: not tested CN II: pupils equal, round and reactive to light, visual fields intact CN III, IV, VI:  full range of motion, no nystagmus, no ptosis CN V: hyperesthesia left V2 CN VII: upper and lower face symmetric CN VIII: hearing intact CN IX, X: gag intact, uvula midline CN XI: sternocleidomastoid and trapezius muscles intact CN XII: tongue midline Bulk & Tone: normal, no fasciculations. Motor:  muscle strength 5/5 throughout Sensation:  Pinprick, temperature and vibratory sensation intact. Deep Tendon Reflexes:  2+ throughout,  toes downgoing.   Finger to nose testing:  Without dysmetria.   Heel to shin:  Without dysmetria.   Gait:  Normal station and stride.  Romberg negative.    Thank you for allowing me to take part in the care of this patient.  , DO

## 2021-03-31 ENCOUNTER — Telehealth: Payer: Self-pay

## 2021-03-31 NOTE — Telephone Encounter (Signed)
Pt called no answer left a voice mail per DPR that lab work was normal  °

## 2021-05-24 ENCOUNTER — Other Ambulatory Visit: Payer: No Typology Code available for payment source

## 2021-06-07 ENCOUNTER — Ambulatory Visit
Admission: RE | Admit: 2021-06-07 | Discharge: 2021-06-07 | Disposition: A | Discharge status: Home / Self Care | Payer: No Typology Code available for payment source | Source: Ambulatory Visit | Attending: Neurology | Admitting: Neurology

## 2021-06-07 ENCOUNTER — Other Ambulatory Visit: Payer: Self-pay

## 2021-06-07 DIAGNOSIS — G500 Trigeminal neuralgia: Secondary | ICD-10-CM

## 2021-06-07 MED ORDER — GADOBENATE DIMEGLUMINE 529 MG/ML IV SOLN
15.0000 mL | Freq: Once | INTRAVENOUS | Status: AC | PRN
  Administered 2021-06-07: 15 mL via INTRAVENOUS

## 2021-06-22 ENCOUNTER — Telehealth: Payer: Self-pay | Admitting: Neurology

## 2021-06-22 NOTE — Telephone Encounter (Signed)
Pt called back in to get his MRI results ?

## 2021-06-22 NOTE — Progress Notes (Signed)
LMOVM for pt to call the office back.

## 2021-06-23 ENCOUNTER — Telehealth: Payer: Self-pay

## 2021-06-23 NOTE — Telephone Encounter (Signed)
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  As long as the oxcarbazepine is controlling the pain, there isn't anything to do about it.  If medication does not control it, then we can always refer to neurosurgery for possible intervention.   ?

## 2021-06-23 NOTE — Telephone Encounter (Signed)
Patient advised earlier in the day, Please result note.  ?

## 2021-06-23 NOTE — Telephone Encounter (Signed)
Patient advised of his results, He will give [place] a call if he feels that the Neurosurgeon is necessary. ? ?

## 2021-06-23 NOTE — Telephone Encounter (Signed)
Patient was returning a call for his MRI results. ?

## 2021-09-15 ENCOUNTER — Encounter: Payer: Self-pay | Admitting: Neurology

## 2021-10-10 ENCOUNTER — Encounter: Payer: Self-pay | Admitting: Neurology

## 2021-11-03 ENCOUNTER — Encounter: Payer: Self-pay | Admitting: Neurology

## 2022-01-25 ENCOUNTER — Other Ambulatory Visit (INDEPENDENT_AMBULATORY_CARE_PROVIDER_SITE_OTHER): Payer: No Typology Code available for payment source

## 2022-01-25 ENCOUNTER — Encounter: Payer: Self-pay | Admitting: Neurology

## 2022-01-25 ENCOUNTER — Other Ambulatory Visit: Payer: Self-pay | Admitting: Neurology

## 2022-01-25 ENCOUNTER — Telehealth: Payer: Self-pay

## 2022-01-25 DIAGNOSIS — G500 Trigeminal neuralgia: Secondary | ICD-10-CM

## 2022-01-25 DIAGNOSIS — Z79899 Other long term (current) drug therapy: Secondary | ICD-10-CM | POA: Diagnosis not present

## 2022-01-25 NOTE — Telephone Encounter (Signed)
Per Dr.Jaffe, Please send prescription for gabapentin 300mg  three times daily and order MRI of left trigeminal nerve with and without contrast for trigeminal neuralgia.  Orders added

## 2022-01-26 LAB — BASIC METABOLIC PANEL
BUN: 18 mg/dL (ref 6–23)
CO2: 32 mEq/L (ref 19–32)
Calcium: 9.7 mg/dL (ref 8.4–10.5)
Chloride: 101 mEq/L (ref 96–112)
Creatinine, Ser: 0.88 mg/dL (ref 0.40–1.50)
GFR: 105.8 mL/min (ref >60.00)
Glucose, Bld: 68 mg/dL — ABNORMAL LOW (ref 70–99)
Potassium: 4.4 mEq/L (ref 3.5–5.1)
Sodium: 140 mEq/L (ref 135–145)

## 2022-02-03 ENCOUNTER — Encounter (HOSPITAL_COMMUNITY): Payer: Self-pay

## 2022-02-03 ENCOUNTER — Ambulatory Visit (HOSPITAL_COMMUNITY)
Admission: EM | Admit: 2022-02-03 | Discharge: 2022-02-03 | Disposition: A | Discharge status: Home / Self Care | Payer: No Typology Code available for payment source | Attending: Family Medicine | Admitting: Family Medicine

## 2022-02-03 DIAGNOSIS — S61412A Laceration without foreign body of left hand, initial encounter: Secondary | ICD-10-CM | POA: Diagnosis not present

## 2022-02-03 MED ORDER — LIDOCAINE HCL (PF) 2 % IJ SOLN
INTRAMUSCULAR | Status: AC
  Filled 2022-02-03: qty 5

## 2022-02-03 NOTE — ED Provider Notes (Signed)
[facility]    CSN: [id number] Arrival date & time: 02/03/22  1932      History   Chief Complaint Chief Complaint  Patient presents with   Laceration    To left hand    HPI Jesse Carney is a 41 y.o. male.    Laceration  Here for cut to his left hand.   He was going to catch a plate that he was putting in the dishwasher and it fell and broke and cut him on the border of his left hand. He is allergic to anti-inflammatories it caused facial swelling.  He is not on any blood thinners  Last tetanus was about 5 years ago  History reviewed. No pertinent past medical history.  Patient Active Problem List   Diagnosis Date Noted   Head injury 05/22/2016   Acute post-traumatic headache, not intractable 05/22/2016    Past Surgical History:  Procedure Laterality Date   bone correction     WISDOM TOOTH EXTRACTION         Home Medications    Prior to Admission medications   Medication Sig Start Date End Date Taking? Authorizing Provider  OXcarbazepine (TRILEPTAL) 150 MG tablet Take 2 tablets (300 mg total) by mouth 2 (two) times daily. 03/30/21  Yes Drema Dallas, DO    Family History Family History  Problem Relation Age of Onset   Hypertension Mother    Hypertension Father     Social History Social History   Tobacco Use   Smoking status: Never   Smokeless tobacco: Never  Vaping Use   Vaping Use: Never used  Substance Use Topics   Alcohol use: No   Drug use: No     Allergies   Ibuprofen and Nsaids   Review of Systems Review of Systems   Physical Exam Triage Vital Signs ED Triage Vitals  Enc Vitals Group     BP 02/03/22 1946 (!) 140/60     Pulse Rate 02/03/22 1946 67     Resp 02/03/22 1946 18     Temp 02/03/22 1946 97.8 F (36.6 C)     Temp Source 02/03/22 1946 Oral     SpO2 02/03/22 1946 94 %     Weight --      Height --      Head Circumference --      Peak Flow --      Pain Score 02/03/22 1947 6     Pain Loc --       Pain Edu? --      Excl. in GC? --    No data found.  Updated Vital Signs BP (!) 140/60 (BP Location: Right Arm)   Pulse 67   Temp 97.8 F (36.6 C) (Oral)   Resp 18   SpO2 94%   Visual Acuity Right Eye Distance:   Left Eye Distance:   Bilateral Distance:    Right Eye Near:   Left Eye Near:    Bilateral Near:     Physical Exam Vitals reviewed.  Skin:    Coloration: Skin is not pale.     Comments: There is a 1.5 cm long laceration that is linear and is on the border of the left palm.  It was bleeding initially but nursing staff placed a pressure bandage on it and bleeding was controlled at the time of exam.  Pulses are normal and capillary refill is normal.  Neuro exam is normal  Neurological:     General: No focal  deficit present.     Mental Status: He is alert and oriented to person, place, and time.  Psychiatric:        Behavior: Behavior normal.      UC Treatments / Results  Labs (all labs ordered are listed, but only abnormal results are displayed) Labs Reviewed - No data to display  EKG   Radiology No results found.  Procedures Procedures (including critical care time)  Medications Ordered in UC Medications - No data to display  Initial Impression / Assessment and Plan / UC Course  I have reviewed the triage vital signs and the nursing notes.  Pertinent labs & imaging results that were available during my care of the patient were reviewed by me and considered in my medical decision making (see chart for details).        After verbal consent is obtained 2% lidocaine is placed for infiltration anesthesia with good results.  Under clean conditions 3 sutures were placed with 5-0 nylon.  Good hemostasis was achieved and the skin edges were approximated.  No complications; EBL approximately 3 mL.  While suturing the patient bleeding was started up but then hemostasis was achieved after the wound was closed. Wound care is explained and bandage  applied  Final Clinical Impressions(s) / UC Diagnoses   Final diagnoses:  Laceration of left hand without foreign body, initial encounter     Discharge Instructions      We have placed 3 sutures  Change bandage after cleaning it with peroxide or soapy water 1-2 times daily.  Stitches need to come out in 5 to 7 days.  Ice and elevate it tonight.  You can take Tylenol as needed for the pain     ED Prescriptions   None    PDMP not reviewed this encounter.   Zenia Resides, MD 02/03/22 2053

## 2022-02-03 NOTE — Discharge Instructions (Signed)
We have placed 3 sutures  Change bandage after cleaning it with peroxide or soapy water 1-2 times daily.  Stitches need to come out in 5 to 7 days.  Ice and elevate it tonight.  You can take Tylenol as needed for the pain

## 2022-02-03 NOTE — ED Triage Notes (Signed)
Cut hand with broken dish trying to take it out of the dishwasher and it slipped and he tried to catch it and cut his hand. About 1 inch cut under pinky

## 2022-02-14 ENCOUNTER — Ambulatory Visit (HOSPITAL_COMMUNITY): Payer: No Typology Code available for payment source

## 2022-03-30 ENCOUNTER — Ambulatory Visit: Payer: No Typology Code available for payment source | Admitting: Neurology

## 2022-04-04 NOTE — Progress Notes (Deleted)
NEUROLOGY FOLLOW UP OFFICE NOTE  Jesse Carney BE:[id number]  Assessment/Plan:   Left sided trigeminal neuralgia   Oxcarbazepine.  If 191m once daily is effective, he may continue as needed.  Check BMP. I think as part of workup, he should have MRI of brain with and without contrast to evaluate for secondary intracranial etiology. Otherwise, follow up in one year.     Subjective:  Jesse Carney a 42year old male who follows up for left-sided trigeminal neuralgia.  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  Follow up MRI of trigeminal nerve was ordered but ***.  He started experiencing increased pain and had increased oxcarbazepine up to 750m71mily without improvement.  He was started on gabapentin 300mg55mee times daily.  ***   HISTORY:  In February 2018, he was walking upstairs when he hit the top of his head on the overhead.  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  About 3 days later, he developed severe paroxysmal shooting pain radiating from the top of his head down to the left eye, cheek, and left upper tooth.  Paroxysms would last a few minutes and occur daily throughout the day.  There are no associated symptoms such as dizziness, facial numbness, or facial weakness.  Eating, talking, and brushing his teeth may trigger or exacerbate the pain.  Nothing relieved it.  He tried cyclobenzaprine and Tylenol, which were ineffective.  He denied prior history of similar symptoms. I initially saw him in 2019 and started him on oxcarbazepine.  He started  taking oxcarbazepine only as needed.  Symptoms resolved and oxcarbazepine was discontinued later that year.  Flares are very well controlled.  If he starts to feel shocks, he will see his chiropractor.  Cervical spine adjustments tend to resolve it within 2 days.  On rare occasions, he will take oxcarbazepine for a few days if it is more severe.  He only treats with 150mg 42m daily to minimize drowsiness and it seems to be effective.  They occur every few months.  PAST MEDICAL HISTORY: No past medical history on file.  MEDICATIONS: Current Outpatient Medications on File Prior to Visit  Medication Sig Dispense Refill   OXcarbazepine (TRILEPTAL) 150 MG tablet Take 2 tablets (300 mg total) by mouth 2 (two) times daily. 360 tablet 2   No current facility-administered medications on file prior to visit.    ALLERGIES: Allergies  Allergen Reactions   Ibuprofen Swelling   Nsaids Swelling    Allergic to any anti-inflammatories    FAMILY HISTORY: Family History  Problem Relation Age of Onset   Hypertension Mother    Hypertension Father       Objective:  *** General: No acute distress.  Patient appears ***-groomed.   Head:  Normocephalic/atraumatic Eyes:  Fundi examined but not visualized Neck: supple, no paraspinal tenderness, full range of motion Heart:  Regular rate and rhythm Lungs:  Clear to auscultation bilaterally Back: No paraspinal tenderness Neurological Exam: alert and oriented to person, place, and time.  Speech fluent and not dysarthric, language intact.  CN II-XII intact. Bulk and tone normal, muscle strength 5/5 throughout.  Sensation to  light touch intact.  Deep tendon reflexes 2+ throughout, toes downgoing.  Finger to nose testing intact.  Gait normal, Romberg negative.   Jesse Clines, DO  CC: ***

## 2022-04-05 ENCOUNTER — Ambulatory Visit: Payer: No Typology Code available for payment source | Admitting: Neurology

## 2022-04-05 ENCOUNTER — Encounter: Payer: Self-pay | Admitting: Neurology

## 2022-04-20 ENCOUNTER — Telehealth: Payer: Self-pay | Admitting: Neurology

## 2022-04-20 DIAGNOSIS — G500 Trigeminal neuralgia: Secondary | ICD-10-CM

## 2022-04-20 NOTE — Telephone Encounter (Signed)
Patient left message with after hour service on 04-20-22 @ 12:24 pm    Caller states the Dr wanted him to get a [employer] MRI and his employee offers a program that thye can get it free and he would like to have the DR fax over the MRI to his employers office

## 2022-04-21 NOTE — Telephone Encounter (Signed)
Called and spoke to patient he stated he needs the MRI sent to [facility] with fax#:[phone]. Patient stated that it is a free MRI program that his job offers. Informed patient that I will get that sent for him.

## 2022-04-21 NOTE — Telephone Encounter (Signed)
MRI Order has been signed and faxed. Patient is aware.

## 2022-04-28 ENCOUNTER — Telehealth: Payer: Self-pay | Admitting: Neurology

## 2022-04-28 NOTE — Telephone Encounter (Signed)
I received report of the MRI of the trigeminal nerves performed on 04/25/2022.  It reveals a meningioma, which is a benign tumor, but it may be pressing on the trigeminal nerve which can contribute to his facial pain.  He really needs to make a follow up appointment to discuss further, as it has been over a year since I saw him

## 2022-04-28 NOTE — Telephone Encounter (Signed)
Left message to call office at 3:02 04/28/2022

## 2022-04-28 NOTE — Telephone Encounter (Signed)
No answer again at 4:23 04/28/2022

## 2022-05-02 NOTE — Telephone Encounter (Signed)
No answer again at 11:20 05/02/2022

## 2022-05-22 NOTE — Telephone Encounter (Signed)
Patient is returning a call. °

## 2022-05-22 NOTE — Telephone Encounter (Signed)
LMOVM for patient to call the office back.

## 2023-01-17 ENCOUNTER — Telehealth: Payer: Self-pay | Admitting: Neurology

## 2023-01-17 DIAGNOSIS — G500 Trigeminal neuralgia: Secondary | ICD-10-CM

## 2023-01-17 MED ORDER — GABAPENTIN 300 MG PO CAPS: 300.0000 mg | ORAL_CAPSULE | Freq: Three times a day (TID) | ORAL | 0 refills | Status: DC

## 2023-01-17 NOTE — Progress Notes (Signed)
Virtual Visit via Video Note  Consent was obtained for video visit:  Yes.   Answered questions that patient had about telehealth interaction:  Yes.   I discussed the limitations, risks, security and privacy concerns of performing an evaluation and management service by telemedicine. I also discussed with the patient that there may be a patient responsible charge related to this service. The patient expressed understanding and agreed to proceed.  Pt location: Home Physician Location: office Name of referring provider:  No ref. provider found I connected with [name] at patients initiation/request on 01/19/2023 at  9:10 AM EDT by video enabled telemedicine application and verified that I am speaking with the correct person using two identifiers. 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  If pain not adequately controlled in a week, contact me and we can increase gabapentin to 600mg  three times daily May use over the counter lidocaine patch to face if needed. Refer to neurosurgery regarding left cerebellar meningioma likely causing trigeminal neuralgia. Follow up 8 weeks.   Subjective:  Jesse Carney is a 42 year old male who follows up for left-sided trigeminal neuralgia.  UPDATE: Current medications:  gabapentin 300mg  three times daily.  Last seen in January 2023. Due to ongoing pain, he had an MRI of the brain with and without contrast on 06/07/2021, which was personally reviewed, and revealed a large left cerebellar developmental venous anomaly in proximity of the cisternal left trigeminal nerve.  Follow up MRI of temporal bone/IAC/trigeminal nerves on 04/25/2022 confirmed a developmental venous anomaly involving the left cerebellar hemisphere as  well as meningioma with affacement of the left cerebellopontine angle cistern causing mass effect upon the the left trigeminal nerve root entry zone.  He was advised to make a follow up appointment to discuss further, including neurosurgery referral but lost to follow up.  He had recurrence of pain earlier this year.  Increased oxcarbazepine to 600mg  twice daily, but he had a reaction.  Plan was to switch to gabapentin.  Pain significantly flared up several days ago.  Just started gabapentin 2 days ago.    06/07/2021 MRI BRAIN W WO:  Large left cerebellar developmental venous anomaly. There is an ectatic draining vein along the left cerebellopontine angle that likely reflects a varix. Notably, this is in proximity to the cisternal left trigeminal nerve, which is not as well evaluated on these large field-of-view images. 04/25/2022 MRI TEMPORAL BONE/IAC W WO:  1.  Extra-axial mass attached to the left tentorial leaflet and left petrous ridge, compatible with a meningioma, associated with effacement of the left cerebellopontine angle cistern and mass effect upon the cisternal segment and nerve root entry zone of the left trigeminal nerve.  2.  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 This could be further evaluated with a CT temporal bone if clinically indicated. 3.  Developmental venous anomaly involving the left cerebellar hemisphere, left middle cerebellar peduncle and left aspect of the pons. 4.  Abnormal diminutive appearance of the intracranial and visualized extracranial portion of the right ICA which could be related to agenesis or age-indeterminate high-grade stenosis. The right middle cerebral artery appears predominantly supplied via the basilar artery by a hypertrophied posterior communicating artery. As clinically indicated this can be better evaluated with CTA head and neck.   HISTORY:  In February 2018, he was walking upstairs when he hit the top of his head on  the overhead.  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  About 3 days later, he developed severe paroxysmal shooting pain radiating from the top of his head down to the left eye, cheek, and left upper tooth.  Paroxysms would last a few minutes and occur daily throughout the day.  There are no associated symptoms such as dizziness, facial numbness, or facial weakness.  Eating, talking, and brushing his teeth may trigger or exacerbate the pain.  Nothing relieved it.  He tried cyclobenzaprine and Tylenol, which were ineffective.  He denied prior history of similar symptoms. I initially saw him in 2019 and started him on oxcarbazepine.  He started taking oxcarbazepine only as needed.  Symptoms resolved and oxcarbazepine was discontinued later that year.  Flares are very well controlled.  If he starts to feel shocks, he will see his chiropractor.  Cervical spine adjustments tend to resolve it within 2 days.  On rare occasions, he will take oxcarbazepine for a few days if it is more severe.  He only treats with 150mg  once daily to minimize drowsiness and it seems to be effective.  They occur every few months.   Past Medical History: No past medical history on file.  Medications: Outpatient Encounter Medications as of 01/19/2023  Medication Sig   gabapentin (NEURONTIN) 300 MG capsule Take 1 capsule (300 mg total) by mouth 3 (three) times daily.   [DISCONTINUED] OXcarbazepine (TRILEPTAL) 150 MG tablet Take 2 tablets (300 mg total) by mouth 2 (two) times daily.   No facility-administered encounter medications on file as of 01/19/2023.    Allergies: Allergies  Allergen Reactions   Ibuprofen Swelling   Nsaids Swelling    Allergic to any anti-inflammatories   Oxcarbazepine Swelling    Lip swelling and fever blisters    Family History: Family History  Problem Relation Age of Onset    Hypertension Mother    Hypertension Father     Observations/Objective:   No acute distress.  Alert and oriented.  Speech fluent and not dysarthric.  Language intact.  Eyes orthophoric on primary gaze.  Face symmetric.   Follow Up Instructions:    -I discussed the assessment and treatment plan with the patient. The patient was provided an opportunity to ask questions and all were answered. The patient agreed with the plan and demonstrated an understanding of the instructions.   The patient was advised to call back or seek an in-person evaluation if the symptoms worsen or if the condition fails to improve as anticipated.   Cira Servant, DO

## 2023-01-17 NOTE — Telephone Encounter (Signed)
  Please send in prescription for gabapentin 300mg  three times daily    Patient advised.    Gabapentin 300 mg TID sent to the [facility] as requested.

## 2023-01-17 NOTE — Telephone Encounter (Signed)
Per patient he was never given a script for Gabapentin so he has never taken it as for the Oxcarbazepine he had an allergic reaction to it.   Added to the patient allergy list.   Patient wanted to know if he has to wait until Friday to get a script or will one be called in for him.

## 2023-01-17 NOTE — Telephone Encounter (Signed)
Patient called stating that he is having severe facial pain and is wanting Gabapentin . I made him a follow up for Friday at 9:10am due to him not being seen in a a year

## 2023-01-19 ENCOUNTER — Telehealth (INDEPENDENT_AMBULATORY_CARE_PROVIDER_SITE_OTHER): Payer: Managed Care, Other (non HMO) | Admitting: Neurology

## 2023-01-19 ENCOUNTER — Encounter: Payer: Self-pay | Admitting: Neurology

## 2023-01-19 DIAGNOSIS — D320 Benign neoplasm of cerebral meninges: Secondary | ICD-10-CM | POA: Diagnosis not present

## 2023-01-19 DIAGNOSIS — G500 Trigeminal neuralgia: Secondary | ICD-10-CM

## 2023-01-19 MED ORDER — PREDNISONE 10 MG PO TABS: ORAL_TABLET | ORAL | 0 refills | Status: DC

## 2023-01-19 NOTE — Patient Instructions (Signed)
Prednisone taper Continue gabapentin 300mg  three times daily.  If pain not adequately controlled in a week, contact me and we can increase gabapentin to 600mg  three times daily May use over the counter lidocaine patch to face if needed. Refer to neurosurgery Follow up 8 weeks.

## 2023-01-21 ENCOUNTER — Encounter: Payer: Self-pay | Admitting: Neurology

## 2023-01-22 ENCOUNTER — Other Ambulatory Visit: Payer: Self-pay | Admitting: Neurology

## 2023-01-22 MED ORDER — BACLOFEN 10 MG PO TABS: 10.0000 mg | ORAL_TABLET | Freq: Three times a day (TID) | ORAL | 5 refills | Status: DC | PRN

## 2023-01-24 ENCOUNTER — Other Ambulatory Visit: Payer: Self-pay

## 2023-01-24 DIAGNOSIS — G500 Trigeminal neuralgia: Secondary | ICD-10-CM

## 2023-01-24 MED ORDER — GABAPENTIN 300 MG PO CAPS: ORAL_CAPSULE | ORAL | 2 refills | Status: DC

## 2023-01-24 NOTE — Progress Notes (Signed)
Per Dr.Jaffe,  He should be on gabapentin 300mg  twice daily.  If that is correct, increase to 600mg  twice daily     Per patient he is taking 300 TID.   Gabapentin 300 mg #2 TID called into the pharmacy.

## 2023-01-24 NOTE — Addendum Note (Signed)
Addended by: Leida Lauth on: 01/24/2023 03:51 PM   Modules accepted: Orders

## 2023-01-26 ENCOUNTER — Telehealth: Payer: Self-pay

## 2023-01-26 NOTE — Telephone Encounter (Signed)
Gabapentin 600 mg TID, wife meant to call [facility] to check the status of Referral.  Phone number given in [place] message.

## 2023-01-26 NOTE — Telephone Encounter (Signed)
Pt's wife called in and left a message. The pt has been dealing with his severe trigeminal neuralgia symptoms. The patient can barely talk. Wants to speak with someone about symptoms and possible treatment. She also wants to follow up on a referral that was sent.

## 2023-01-30 ENCOUNTER — Other Ambulatory Visit: Payer: Self-pay | Admitting: Neurosurgery

## 2023-01-30 ENCOUNTER — Encounter: Payer: Self-pay | Admitting: Neurosurgery

## 2023-01-30 DIAGNOSIS — Q278 Other specified congenital malformations of peripheral vascular system: Secondary | ICD-10-CM

## 2023-02-02 ENCOUNTER — Ambulatory Visit
Admission: RE | Admit: 2023-02-02 | Discharge: 2023-02-02 | Disposition: A | Discharge status: Home / Self Care | Payer: Managed Care, Other (non HMO) | Source: Ambulatory Visit | Attending: Neurosurgery | Admitting: Neurosurgery

## 2023-02-02 DIAGNOSIS — Q278 Other specified congenital malformations of peripheral vascular system: Secondary | ICD-10-CM

## 2023-02-02 MED ORDER — IOPAMIDOL (ISOVUE-370) INJECTION 76%
75.0000 mL | Freq: Once | INTRAVENOUS | Status: AC | PRN
  Administered 2023-02-02: 75 mL via INTRAVENOUS

## 2023-02-08 ENCOUNTER — Encounter: Payer: Self-pay | Admitting: Neurology

## 2023-02-08 ENCOUNTER — Other Ambulatory Visit: Payer: Self-pay | Admitting: Neurology

## 2023-02-08 MED ORDER — CARBAMAZEPINE 200 MG PO TABS: ORAL_TABLET | ORAL | 0 refills | Status: DC

## 2023-02-09 ENCOUNTER — Other Ambulatory Visit (INDEPENDENT_AMBULATORY_CARE_PROVIDER_SITE_OTHER): Payer: Managed Care, Other (non HMO)

## 2023-02-09 ENCOUNTER — Other Ambulatory Visit: Payer: Self-pay

## 2023-02-09 DIAGNOSIS — Z79899 Other long term (current) drug therapy: Secondary | ICD-10-CM

## 2023-02-09 NOTE — Progress Notes (Unsigned)
Per DR.Everlena Cooper, I sent in a prescription for carbamazepine but he must get labs drawn (CBC and CMP) today or tomorrow.    Orders added patient advised.

## 2023-02-10 LAB — COMPREHENSIVE METABOLIC PANEL
ALT: 17 [IU]/L (ref 0–44)
AST: 20 [IU]/L (ref 0–40)
Albumin: 4.3 g/dL (ref 4.1–5.1)
Alkaline Phosphatase: 38 [IU]/L — ABNORMAL LOW (ref 44–121)
BUN/Creatinine Ratio: 17 (ref 9–20)
BUN: 18 mg/dL (ref 6–24)
Bilirubin Total: 0.2 mg/dL (ref 0.0–1.2)
CO2: 22 mmol/L (ref 20–29)
Calcium: 9.6 mg/dL (ref 8.7–10.2)
Chloride: 99 mmol/L (ref 96–106)
Creatinine, Ser: 1.03 mg/dL (ref 0.76–1.27)
Globulin, Total: 2.8 g/dL (ref 1.5–4.5)
Glucose: 73 mg/dL (ref 70–99)
Potassium: 4.2 mmol/L (ref 3.5–5.2)
Sodium: 138 mmol/L (ref 134–144)
Total Protein: 7.1 g/dL (ref 6.0–8.5)
eGFR: 92 mL/min/{1.73_m2} (ref >59)

## 2023-02-10 LAB — CBC
Hematocrit: 42.8 % (ref 37.5–51.0)
Hemoglobin: 13.9 g/dL (ref 13.0–17.7)
MCH: 28.4 pg (ref 26.6–33.0)
MCHC: 32.5 g/dL (ref 31.5–35.7)
MCV: 88 fL (ref 79–97)
Platelets: 184 10*3/uL (ref 150–450)
RBC: 4.89 x10E6/uL (ref 4.14–5.80)
RDW: 13 % (ref 11.6–15.4)
WBC: 3.9 10*3/uL (ref 3.4–10.8)

## 2023-02-15 ENCOUNTER — Encounter: Payer: Self-pay | Admitting: Neurology

## 2023-02-16 ENCOUNTER — Other Ambulatory Visit: Payer: Self-pay | Admitting: Neurology

## 2023-02-16 ENCOUNTER — Other Ambulatory Visit: Payer: Self-pay | Admitting: Radiology

## 2023-02-16 DIAGNOSIS — G500 Trigeminal neuralgia: Secondary | ICD-10-CM

## 2023-02-16 MED ORDER — GABAPENTIN 300 MG PO CAPS: ORAL_CAPSULE | ORAL | 1 refills | Status: DC

## 2023-02-19 ENCOUNTER — Inpatient Hospital Stay: Payer: Managed Care, Other (non HMO) | Attending: Neurosurgery

## 2023-02-21 ENCOUNTER — Other Ambulatory Visit: Payer: Self-pay | Admitting: Neurology

## 2023-02-21 ENCOUNTER — Telehealth: Payer: Self-pay

## 2023-02-21 DIAGNOSIS — G500 Trigeminal neuralgia: Secondary | ICD-10-CM

## 2023-02-21 MED ORDER — CARBAMAZEPINE 200 MG PO TABS: ORAL_TABLET | ORAL | 1 refills | Status: DC

## 2023-02-21 NOTE — Telephone Encounter (Signed)
90 day supply request received from [facility].

## 2023-03-01 ENCOUNTER — Encounter: Payer: Self-pay | Admitting: Neurology

## 2023-03-05 ENCOUNTER — Other Ambulatory Visit: Payer: Self-pay | Admitting: Neurosurgery

## 2023-03-05 DIAGNOSIS — Q278 Other specified congenital malformations of peripheral vascular system: Secondary | ICD-10-CM

## 2023-03-06 ENCOUNTER — Other Ambulatory Visit: Payer: Self-pay | Admitting: Neurosurgery

## 2023-03-06 ENCOUNTER — Other Ambulatory Visit: Payer: Self-pay | Admitting: Neurology

## 2023-03-06 MED ORDER — CARBAMAZEPINE 200 MG PO TABS: 300.0000 mg | ORAL_TABLET | Freq: Two times a day (BID) | ORAL | 5 refills | Status: DC

## 2023-03-13 ENCOUNTER — Other Ambulatory Visit: Payer: Self-pay

## 2023-03-13 DIAGNOSIS — G500 Trigeminal neuralgia: Secondary | ICD-10-CM

## 2023-03-13 MED ORDER — GABAPENTIN 300 MG PO CAPS: ORAL_CAPSULE | ORAL | 1 refills | Status: AC

## 2023-03-16 ENCOUNTER — Other Ambulatory Visit: Payer: Self-pay

## 2023-03-16 ENCOUNTER — Other Ambulatory Visit: Payer: Self-pay | Admitting: Neurosurgery

## 2023-03-16 ENCOUNTER — Ambulatory Visit (HOSPITAL_COMMUNITY)
Admission: RE | Admit: 2023-03-16 | Discharge: 2023-03-16 | Disposition: A | Discharge status: Home / Self Care | Payer: Managed Care, Other (non HMO) | Source: Ambulatory Visit | Attending: Neurosurgery | Admitting: Neurosurgery

## 2023-03-16 DIAGNOSIS — Q278 Other specified congenital malformations of peripheral vascular system: Secondary | ICD-10-CM

## 2023-03-16 HISTORY — PX: IR ANGIO INTRA EXTRACRAN SEL INTERNAL CAROTID UNI L MOD SED: IMG5361

## 2023-03-16 HISTORY — PX: IR ANGIO EXTERNAL CAROTID SEL EXT CAROTID UNI R MOD SED: IMG5371

## 2023-03-16 HISTORY — PX: IR ANGIO VERTEBRAL SEL VERTEBRAL BILAT MOD SED: IMG5369

## 2023-03-16 LAB — PROTIME-INR
INR: 1.2 (ref 0.8–1.2)
Prothrombin Time: 15.4 s — ABNORMAL HIGH (ref 11.4–15.2)

## 2023-03-16 LAB — APTT: aPTT: 33 s (ref 24–36)

## 2023-03-16 LAB — CBC WITH DIFFERENTIAL/PLATELET
Abs Immature Granulocytes: 0 10*3/uL (ref 0.00–0.07)
Basophils Absolute: 0.1 10*3/uL (ref 0.0–0.1)
Basophils Relative: 2 %
Eosinophils Absolute: 0.1 10*3/uL (ref 0.0–0.5)
Eosinophils Relative: 2 %
HCT: 39.3 % (ref 39.0–52.0)
Hemoglobin: 13.4 g/dL (ref 13.0–17.0)
Immature Granulocytes: 0 %
Lymphocytes Relative: 40 %
Lymphs Abs: 1.7 10*3/uL (ref 0.7–4.0)
MCH: 29.3 pg (ref 26.0–34.0)
MCHC: 34.1 g/dL (ref 30.0–36.0)
MCV: 85.8 fL (ref 80.0–100.0)
Monocytes Absolute: 0.6 10*3/uL (ref 0.1–1.0)
Monocytes Relative: 13 %
Neutro Abs: 1.8 10*3/uL (ref 1.7–7.7)
Neutrophils Relative %: 43 %
Platelets: 210 10*3/uL (ref 150–400)
RBC: 4.58 MIL/uL (ref 4.22–5.81)
RDW: 12.9 % (ref 11.5–15.5)
WBC: 4.3 10*3/uL (ref 4.0–10.5)
nRBC: 0 % (ref 0.0–0.2)

## 2023-03-16 LAB — BASIC METABOLIC PANEL
Anion gap: 8 (ref 5–15)
BUN: 15 mg/dL (ref 6–20)
CO2: 26 mmol/L (ref 22–32)
Calcium: 9 mg/dL (ref 8.9–10.3)
Chloride: 104 mmol/L (ref 98–111)
Creatinine, Ser: 0.95 mg/dL (ref 0.61–1.24)
GFR, Estimated: >60 mL/min (ref >60)
Glucose, Bld: 81 mg/dL (ref 70–99)
Potassium: 4.1 mmol/L (ref 3.5–5.1)
Sodium: 138 mmol/L (ref 135–145)

## 2023-03-16 LAB — URINALYSIS, ROUTINE W REFLEX MICROSCOPIC
Bilirubin Urine: NEGATIVE
Glucose, UA: NEGATIVE mg/dL
Hgb urine dipstick: NEGATIVE
Ketones, ur: 5 mg/dL — AB
Leukocytes,Ua: NEGATIVE
Nitrite: NEGATIVE
Protein, ur: NEGATIVE mg/dL
Specific Gravity, Urine: 1.021 (ref 1.005–1.030)
pH: 5 (ref 5.0–8.0)

## 2023-03-16 MED ORDER — LIDOCAINE HCL 1 % IJ SOLN
20.0000 mL | Freq: Once | INTRAMUSCULAR | Status: AC
  Administered 2023-03-16: 8 mL

## 2023-03-16 MED ORDER — CHLORHEXIDINE GLUCONATE CLOTH 2 % EX PADS: 6.0000 | MEDICATED_PAD | Freq: Once | CUTANEOUS | Status: DC

## 2023-03-16 MED ORDER — FENTANYL CITRATE (PF) 100 MCG/2ML IJ SOLN
INTRAMUSCULAR | Status: AC | PRN
  Administered 2023-03-16: 25 ug via INTRAVENOUS

## 2023-03-16 MED ORDER — FENTANYL CITRATE (PF) 100 MCG/2ML IJ SOLN
INTRAMUSCULAR | Status: AC
  Filled 2023-03-16: qty 4

## 2023-03-16 MED ORDER — CEFAZOLIN SODIUM-DEXTROSE 2-4 GM/100ML-% IV SOLN: 2.0000 g | INTRAVENOUS | Status: DC

## 2023-03-16 MED ORDER — MIDAZOLAM HCL 2 MG/2ML IJ SOLN
INTRAMUSCULAR | Status: AC | PRN
  Administered 2023-03-16: 1 mg via INTRAVENOUS

## 2023-03-16 MED ORDER — HEPARIN SODIUM (PORCINE) 1000 UNIT/ML IJ SOLN
INTRAMUSCULAR | Status: AC | PRN
  Administered 2023-03-16: 2000 [IU] via INTRAVENOUS

## 2023-03-16 MED ORDER — MIDAZOLAM HCL 2 MG/2ML IJ SOLN
INTRAMUSCULAR | Status: AC
  Filled 2023-03-16: qty 4

## 2023-03-16 MED ORDER — HEPARIN SODIUM (PORCINE) 1000 UNIT/ML IJ SOLN
INTRAMUSCULAR | Status: AC
  Filled 2023-03-16: qty 10

## 2023-03-16 MED ORDER — IOHEXOL 300 MG/ML  SOLN
150.0000 mL | Freq: Once | INTRAMUSCULAR | Status: AC | PRN
  Administered 2023-03-16: 100 mL via INTRA_ARTERIAL

## 2023-03-16 MED ORDER — HYDROCODONE-ACETAMINOPHEN 5-325 MG PO TABS
1.0000 | ORAL_TABLET | ORAL | Status: DC | PRN
Start: 2023-03-16 — End: 2023-03-17

## 2023-03-16 MED ORDER — LIDOCAINE HCL 1 % IJ SOLN
INTRAMUSCULAR | Status: AC
  Filled 2023-03-16: qty 20

## 2023-03-16 NOTE — Sedation Documentation (Signed)
Patient transported to short stay.  Right femoral site clean, dry, intact and soft. DP and PT pulses +2.

## 2023-03-16 NOTE — Progress Notes (Signed)
Up and walked and tolerated well; right groin stable, no bleeding or hematoma 

## 2023-03-16 NOTE — H&P (Signed)
  Chief Complaint   No chief complaint on file.   History of Present Illness  Jesse Carney is a 42 y.o. male with a history of left trigeminal neuralgia seen by my partner, Dr. Maisie Fus. 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 In addition, there was some possibility of underlying fistula. He was therefore referred for diagnostic cerebral angiogram.  Past Medical History  No past medical history on file.  Past Surgical History   Past Surgical History:  Procedure Laterality Date   bone correction     WISDOM TOOTH EXTRACTION      Social History   Social History   Tobacco Use   Smoking status: Never   Smokeless tobacco: Never  Vaping Use   Vaping status: Never Used  Substance Use Topics   Alcohol use: No   Drug use: No    Medications   Prior to Admission medications   Medication Sig Start Date End Date Taking? Authorizing Provider  baclofen (LIORESAL) 10 MG tablet Take 1 tablet (10 mg total) by mouth 3 (three) times daily as needed for muscle spasms. 01/22/23  Yes Jaffe, Adam R, DO  carbamazepine (TEGRETOL) 200 MG tablet Take 1.5 tablets (300 mg total) by mouth 2 (two) times daily. 03/06/23  Yes Everlena Cooper, Adam R, DO  gabapentin (NEURONTIN) 300 MG capsule Take 600 mg (two caps)  three times a day 03/13/23  Yes Jaffe, Adam R, DO  predniSONE (DELTASONE) 10 MG tablet 60mg  on day 1, then 50mg  on day 2, then 40mg  on day 3, then 30mg  on day 4, then 20mg  on day 5, then 10mg  on day 6, then STOP 01/19/23   Everlena Cooper, Adam R, DO    Allergies   Allergies  Allergen Reactions   Ibuprofen Swelling   Nsaids Swelling    Allergic to any anti-inflammatories   Oxcarbazepine Swelling    Lip swelling and fever blisters    Review of Systems  ROS  Neurologic Exam  Awake, alert, oriented Memory and concentration grossly intact Speech fluent, appropriate CN grossly intact Motor exam: Upper  Extremities Deltoid Bicep Tricep Grip  Right 5/5 5/5 5/5 5/5  Left 5/5 5/5 5/5 5/5   Lower Extremities IP Quad PF DF EHL  Right 5/5 5/5 5/5 5/5 5/5  Left 5/5 5/5 5/5 5/5 5/5   Sensation grossly intact to LT  Impression  - 42 y.o. 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  Plan  - Will proceed with diagnostic cerebral angiogram  I have reviewed the indications for the procedure as well as the details of the procedure and the expected postoperative course and recovery at length with the patient. We have also reviewed in detail the risks, benefits, and alternatives to the procedure. All questions were answered and SHMAYA MACEK provided informed consent to proceed.  Lisbeth Renshaw, MD [facility]

## 2023-08-30 ENCOUNTER — Other Ambulatory Visit: Payer: Self-pay | Admitting: Neurology

## 2023-09-19 NOTE — Progress Notes (Deleted)
 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 Refer to neurosurgery regarding left cerebellar meningioma likely causing trigeminal neuralgia. Follow up 8 weeks.   Subjective:  Jesse Carney is a 43 year old male who follows up for left-sided trigeminal neuralgia.  UPDATE: Current medications:  gabapentin  600mg  three times daily, carbamazepine  200mg  twice daily, baclofen  10mg  three times daily as needed.  Last seen in October 2024 for recurrent TN flare.  Prescribed prednisone  taper, increased gabapentin  and subsequently started carbamazepine , and baclofenl.  Referred to [facility] who considered the left cerebellar DVA as potentially the source of his symptoms.  Did not feel microvascular decompression was a good option as his DVA is emanating from the CPA and deep nuclei of the cerebellum adjacent to the root entry zone, so mobilization may not be possible but did consider percutaneous rhizotomy or radiosurgery.  CT venogram on 02/02/2023 redemonstrated the large left DVA and ectatic draining of vein along the left tentorium.  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  ***    HISTORY:  In February 2018, he was walking upstairs when he hit the top of his head on the overhead.  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  About 3 days later, he developed severe paroxysmal shooting pain radiating from the top of his head down to the left eye, cheek, and left upper tooth.  Paroxysms would last a few minutes and  occur daily throughout the day.  There are no associated symptoms such as dizziness, facial numbness, or facial weakness.  Eating, talking, and brushing his teeth may trigger or exacerbate the pain.  Nothing relieved it.  He tried cyclobenzaprine  and Tylenol , which were ineffective.  He denied prior history of similar symptoms. I initially saw him in 2019 and started him on oxcarbazepine .  He started taking oxcarbazepine  only as needed.  Symptoms resolved and oxcarbazepine  was discontinued later that year.  Flares are very well controlled.  If he starts to feel shocks, he will see his chiropractor.  Cervical spine adjustments tend to resolve it within 2 days.  On rare occasions, he will take oxcarbazepine  for a few days if it is more severe.  He only treats with 150mg  once daily to minimize drowsiness and it seems to be effective.  They occur every few months.  Past medications:  oxcarbazepine  (adverse reaction)  Imaging: 06/07/2021 MRI BRAIN W WO:  Large left cerebellar developmental venous anomaly. There is an ectatic draining vein along the left cerebellopontine angle that likely reflects a varix. Notably, this is in proximity to the cisternal left trigeminal nerve, which is not as well evaluated on these large field-of-view images. 04/25/2022 MRI TEMPORAL BONE/IAC W WO:  1.  Extra-axial mass attached to the left tentorial leaflet and left petrous ridge, compatible with a meningioma, associated with effacement of the left cerebellopontine angle cistern and mass effect upon the cisternal segment and nerve root entry zone of the left trigeminal nerve.  2.  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 This could be further evaluated with a CT temporal bone if clinically indicated. 3.  Developmental venous anomaly involving  the left cerebellar hemisphere, left middle cerebellar peduncle and left aspect of the pons. 4.  Abnormal diminutive appearance of the intracranial and  visualized extracranial portion of the right ICA which could be related to agenesis or age-indeterminate high-grade stenosis. The right middle cerebral artery appears predominantly supplied via the basilar artery by a hypertrophied posterior communicating artery. As clinically indicated this can be better evaluated with CTA head and neck.  02/02/2023 CTV HEAD:  1. No evidence of dural venous sinus thrombosis. 2. Redemonstration of a large left cerebellar developmental venous anomaly, which was better evaluated on 06/07/2021 MRI. 3. Redemonstration of an enhancing lesion along the left tentorium, favored to represent an ectatic draining vein rather than a dural-based tumor, which is likely unchanged when accounting for differences in technique. Evaluation for proximity to the left trigeminal nerve is not possible on CT. If further evaluation is indicated, an MRI with IAC protocol with and without contrast is recommended. 12/202/2024 CEREBRAL ANGIOGRAM:  1. 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 No aneurysms, arteriovenous malformations, or underlying fistulas are seen.  PAST MEDICAL HISTORY: No past medical history on file.  MEDICATIONS: Current Outpatient Medications on File Prior to Visit  Medication Sig Dispense Refill   baclofen  (LIORESAL ) 10 MG tablet Take 1 tablet (10 mg total) by mouth 3 (three) times daily as needed for muscle spasms. 90 each 5   carbamazepine  (TEGRETOL ) 200 MG tablet TAKE 1 TABLET TWICE A DAY 60 tablet 0   gabapentin  (NEURONTIN ) 300 MG capsule Take 600 mg (two caps)  three times a day 270 capsule 1   predniSONE  (DELTASONE ) 10 MG tablet 60mg  on day 1, then 50mg  on day 2, then 40mg  on day 3, then 30mg  on day 4, then 20mg  on day 5, then 10mg  on day 6, then STOP 21 tablet 0   No current facility-administered medications on file prior to visit.    ALLERGIES: Allergies  Allergen Reactions    Ibuprofen Swelling   Nsaids Swelling    Allergic to any anti-inflammatories   Oxcarbazepine  Swelling    Lip swelling and fever blisters    FAMILY HISTORY: Family History  Problem Relation Age of Onset   Hypertension Mother    Hypertension Father       Objective:  *** General: No acute distress.  Patient appears ***-groomed.   Head:  Normocephalic/atraumatic Eyes:  Fundi examined but not visualized Neck: supple, no paraspinal tenderness, full range of motion Heart:  Regular rate and rhythm Lungs:  Clear to auscultation bilaterally Back: No paraspinal tenderness Neurological Exam: alert and oriented.  Speech fluent and not dysarthric, language intact.  CN II-XII intact. Bulk and tone normal, muscle strength 5/5 throughout.  Sensation to light touch intact.  Deep tendon reflexes 2+ throughout, toes downgoing.  Finger to nose testing intact.  Gait normal, Romberg negative.   Juliene Dunnings, DO  CC: ***

## 2023-09-20 ENCOUNTER — Ambulatory Visit: Payer: Managed Care, Other (non HMO) | Admitting: Neurology

## 2023-10-09 ENCOUNTER — Encounter: Payer: Self-pay | Admitting: Neurology

## 2023-10-10 ENCOUNTER — Ambulatory Visit: Admitting: Neurology

## 2023-10-10 ENCOUNTER — Other Ambulatory Visit: Payer: Self-pay | Admitting: Neurology

## 2023-10-10 MED ORDER — CARBAMAZEPINE 200 MG PO TABS: 200.0000 mg | ORAL_TABLET | Freq: Two times a day (BID) | ORAL | 0 refills | Status: DC

## 2023-10-16 NOTE — Progress Notes (Unsigned)
 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 Refer to neurosurgery regarding left cerebellar meningioma likely causing trigeminal neuralgia. Follow up 8 weeks.   Subjective:  Jesse Carney is a 43 year old male who follows up for left-sided trigeminal neuralgia.  UPDATE: Current medications:  gabapentin  600mg  three times daily, carbamazepine  200mg  twice daily, baclofen  10mg  three times daily as needed.  Last seen in October 2024 for recurrent TN flare.  Prescribed prednisone  taper, increased gabapentin  and subsequently started carbamazepine , and baclofenl.  Referred to [facility] who considered the left cerebellar DVA as potentially the source of his symptoms.  Did not feel microvascular decompression was a good option as his DVA is emanating from the CPA and deep nuclei of the cerebellum adjacent to the root entry zone, so mobilization may not be possible but did consider percutaneous rhizotomy or radiosurgery.  CT venogram on 02/02/2023 redemonstrated the large left DVA and ectatic draining of vein along the left tentorium.  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  ***    HISTORY:  In February 2018, he was walking upstairs when he hit the top of his head on the overhead.  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  About 3 days later, he developed severe paroxysmal shooting pain radiating from the top of his head down to the left eye, cheek, and left upper tooth.  Paroxysms would last a few minutes and  occur daily throughout the day.  There are no associated symptoms such as dizziness, facial numbness, or facial weakness.  Eating, talking, and brushing his teeth may trigger or exacerbate the pain.  Nothing relieved it.  He tried cyclobenzaprine  and Tylenol , which were ineffective.  He denied prior history of similar symptoms. I initially saw him in 2019 and started him on oxcarbazepine .  He started taking oxcarbazepine  only as needed.  Symptoms resolved and oxcarbazepine  was discontinued later that year.  Flares are very well controlled.  If he starts to feel shocks, he will see his chiropractor.  Cervical spine adjustments tend to resolve it within 2 days.  On rare occasions, he will take oxcarbazepine  for a few days if it is more severe.  He only treats with 150mg  once daily to minimize drowsiness and it seems to be effective.  They occur every few months.  Past medications:  oxcarbazepine  (adverse reaction)  Imaging: 06/07/2021 MRI BRAIN W WO:  Large left cerebellar developmental venous anomaly. There is an ectatic draining vein along the left cerebellopontine angle that likely reflects a varix. Notably, this is in proximity to the cisternal left trigeminal nerve, which is not as well evaluated on these large field-of-view images. 04/25/2022 MRI TEMPORAL BONE/IAC W WO:  1.  Extra-axial mass attached to the left tentorial leaflet and left petrous ridge, compatible with a meningioma, associated with effacement of the left cerebellopontine angle cistern and mass effect upon the cisternal segment and nerve root entry zone of the left trigeminal nerve.  2.  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 This could be further evaluated with a CT temporal bone if clinically indicated. 3.  Developmental venous anomaly involving  the left cerebellar hemisphere, left middle cerebellar peduncle and left aspect of the pons. 4.  Abnormal diminutive appearance of the intracranial and  visualized extracranial portion of the right ICA which could be related to agenesis or age-indeterminate high-grade stenosis. The right middle cerebral artery appears predominantly supplied via the basilar artery by a hypertrophied posterior communicating artery. As clinically indicated this can be better evaluated with CTA head and neck.  02/02/2023 CTV HEAD:  1. No evidence of dural venous sinus thrombosis. 2. Redemonstration of a large left cerebellar developmental venous anomaly, which was better evaluated on 06/07/2021 MRI. 3. Redemonstration of an enhancing lesion along the left tentorium, favored to represent an ectatic draining vein rather than a dural-based tumor, which is likely unchanged when accounting for differences in technique. Evaluation for proximity to the left trigeminal nerve is not possible on CT. If further evaluation is indicated, an MRI with IAC protocol with and without contrast is recommended. 12/202/2024 CEREBRAL ANGIOGRAM:  1. 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 No aneurysms, arteriovenous malformations, or underlying fistulas are seen.  PAST MEDICAL HISTORY: No past medical history on file.  MEDICATIONS: Current Outpatient Medications on File Prior to Visit  Medication Sig Dispense Refill   baclofen  (LIORESAL ) 10 MG tablet Take 1 tablet (10 mg total) by mouth 3 (three) times daily as needed for muscle spasms. 90 each 5   carbamazepine  (TEGRETOL ) 200 MG tablet Take 1 tablet (200 mg total) by mouth 2 (two) times daily. 60 tablet 0   gabapentin  (NEURONTIN ) 300 MG capsule Take 600 mg (two caps)  three times a day 270 capsule 1   predniSONE  (DELTASONE ) 10 MG tablet 60mg  on day 1, then 50mg  on day 2, then 40mg  on day 3, then 30mg  on day 4, then 20mg  on day 5, then 10mg  on day 6, then STOP 21 tablet 0   No current facility-administered medications on file prior to visit.     ALLERGIES: Allergies  Allergen Reactions   Ibuprofen Swelling   Nsaids Swelling    Allergic to any anti-inflammatories   Oxcarbazepine  Swelling    Lip swelling and fever blisters    FAMILY HISTORY: Family History  Problem Relation Age of Onset   Hypertension Mother    Hypertension Father       Objective:  *** General: No acute distress.  Patient appears ***-groomed.   Head:  Normocephalic/atraumatic Eyes:  Fundi examined but not visualized Neck: supple, no paraspinal tenderness, full range of motion Heart:  Regular rate and rhythm Lungs:  Clear to auscultation bilaterally Back: No paraspinal tenderness Neurological Exam: alert and oriented.  Speech fluent and not dysarthric, language intact.  CN II-XII intact. Bulk and tone normal, muscle strength 5/5 throughout.  Sensation to light touch intact.  Deep tendon reflexes 2+ throughout, toes downgoing.  Finger to nose testing intact.  Gait normal, Romberg negative.   Juliene Dunnings, DO  CC: ***

## 2023-10-17 ENCOUNTER — Encounter: Payer: Self-pay | Admitting: Neurology

## 2023-10-17 ENCOUNTER — Other Ambulatory Visit

## 2023-10-17 ENCOUNTER — Ambulatory Visit (INDEPENDENT_AMBULATORY_CARE_PROVIDER_SITE_OTHER): Admitting: Neurology

## 2023-10-17 VITALS — BP 157/82 | HR 78 | Ht 71.0 in | Wt 174.0 lb

## 2023-10-17 DIAGNOSIS — Z79899 Other long term (current) drug therapy: Secondary | ICD-10-CM | POA: Diagnosis not present

## 2023-10-17 DIAGNOSIS — D320 Benign neoplasm of cerebral meninges: Secondary | ICD-10-CM

## 2023-10-17 DIAGNOSIS — G500 Trigeminal neuralgia: Secondary | ICD-10-CM

## 2023-10-17 LAB — CBC
HCT: 42.2 % (ref 38.5–50.0)
Hemoglobin: 14 g/dL (ref 13.2–17.1)
MCH: 29.1 pg (ref 27.0–33.0)
MCHC: 33.2 g/dL (ref 32.0–36.0)
MCV: 87.7 fL (ref 80.0–100.0)
MPV: 10 fL (ref 7.5–12.5)
Platelets: 189 Thousand/uL (ref 140–400)
RBC: 4.81 Million/uL (ref 4.20–5.80)
RDW: 13.2 % (ref 11.0–15.0)
WBC: 3.3 Thousand/uL — ABNORMAL LOW (ref 3.8–10.8)

## 2023-10-17 LAB — COMPREHENSIVE METABOLIC PANEL WITH GFR
AG Ratio: 1.9 (calc) (ref 1.0–2.5)
ALT: 13 U/L (ref 9–46)
AST: 20 U/L (ref 10–40)
Albumin: 4.5 g/dL (ref 3.6–5.1)
Alkaline phosphatase (APISO): 42 U/L (ref 36–130)
BUN: 19 mg/dL (ref 7–25)
CO2: 28 mmol/L (ref 20–32)
Calcium: 9.5 mg/dL (ref 8.6–10.3)
Chloride: 103 mmol/L (ref 98–110)
Creat: 0.99 mg/dL (ref 0.60–1.29)
Globulin: 2.4 g/dL (ref 1.9–3.7)
Glucose, Bld: 88 mg/dL (ref 65–99)
Potassium: 4.2 mmol/L (ref 3.5–5.3)
Sodium: 139 mmol/L (ref 135–146)
Total Bilirubin: 0.2 mg/dL (ref 0.2–1.2)
Total Protein: 6.9 g/dL (ref 6.1–8.1)
eGFR: 96 mL/min/1.73m2 (ref >=60)

## 2023-10-17 NOTE — Patient Instructions (Addendum)
 Help finding a primary care provider within [place]. If you do not have access to a computer or the Internet you can call [phone] and  they will help you scheduled with a provider.  Or you can go to: [email or web address]    Gabapentin  and carbamazepine  Check CBC and CMP Follow up 8 months.

## 2023-10-18 ENCOUNTER — Ambulatory Visit: Payer: Self-pay | Admitting: Neurology

## 2023-10-30 IMAGING — MR MR HEAD WO/W CM
17 series · 48 of 48 positions shown · IV contrast (15 ML MULTIHANCE)
Comparison: None.

CLINICAL DATA: Trigeminal neuralgia left side of face

EXAM:
MRI HEAD WITHOUT AND WITH CONTRAST
TECHNIQUE: Multiplanar, multiecho pulse sequences of the brain and surrounding
structures were obtained without and with intravenous contrast.
CONTRAST:  15mL MULTIHANCE GADOBENATE DIMEGLUMINE 529 MG/ML IV SOLN

[Series 2: T1 · sagittal · 5.0mm · 0.47mm/px · 1 of 23 slices shown]
[im 1/23]
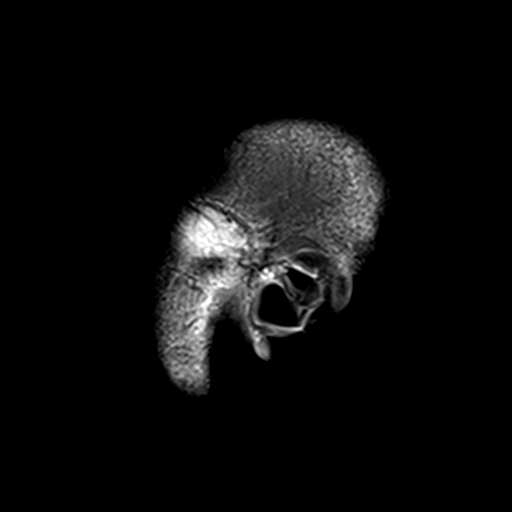

[Series 3: DWI · axial · 3.0mm · 1.80mm/px · z∈[-38,+108]mm · 4 of 100 slices shown (1 of 4)]
[im 1/100]
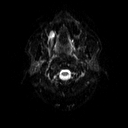
[im 34/100]
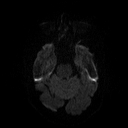
[im 67/100]
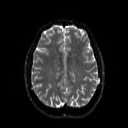
[im 100/100]
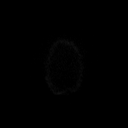

[Series 4: DWI · axial · 3.0mm · 1.80mm/px · z∈[-38,+108]mm · 2 of 49 slices shown (2 of 4)]
[im 1/49]
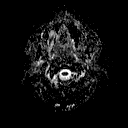
[im 49/49]
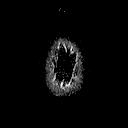

[Series 5: DWI · coronal · 5.0mm · 1.80mm/px · 3 of 69 slices shown (3 of 4)]
[im 1/69]
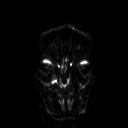
[im 35/69]
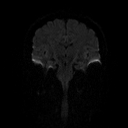
[im 69/69]
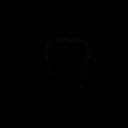

[Series 6: DWI · coronal · 5.0mm · 1.80mm/px · 1 of 35 slices shown (4 of 4)]
[im 1/35]
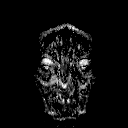

[Series 7: T2 · axial · 5.0mm · 0.60mm/px · 1 of 22 slices shown (1 of 2)]
[im 1/22]
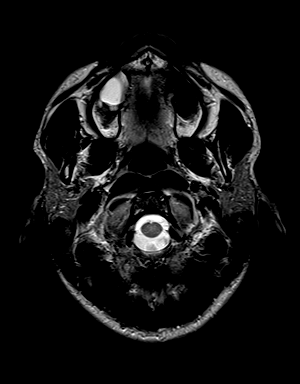

[Series 8: FLAIR · axial · 3.0mm · 0.45mm/px · 1 of 30 slices shown (1 of 3)]
[im 1/30]
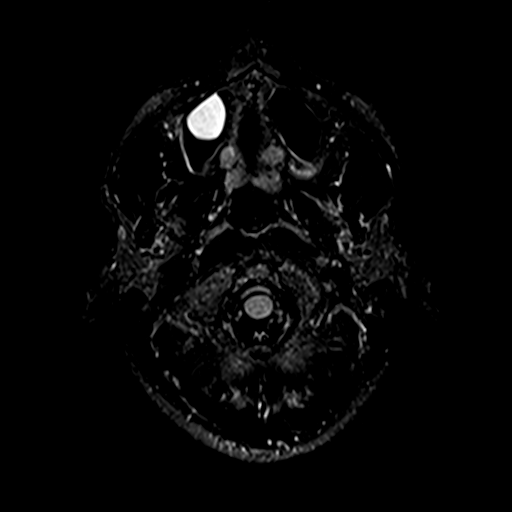

[Series 9: mip_images(sw) · axial · 32.0mm · 0.90mm/px · 1 of 29 slices shown]
[im 1/29]
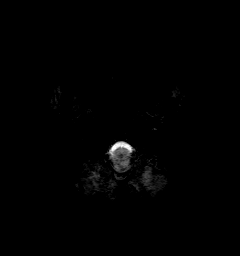

[Series 10: swi_images · axial · 4.0mm · 0.90mm/px · 1 of 36 slices shown]
[im 1/36]
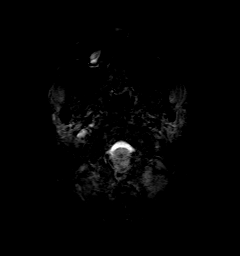

[Series 11: t1_mpr_tra · axial · 1.0mm · 0.75mm/px · z∈[-35,+107]mm · 6 of 144 slices shown]
[im 1/144]
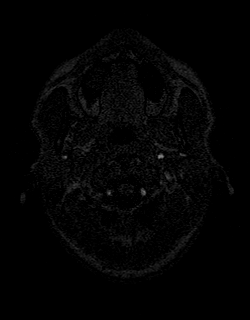
[im 29/144]
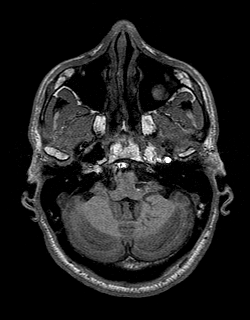
[im 58/144]
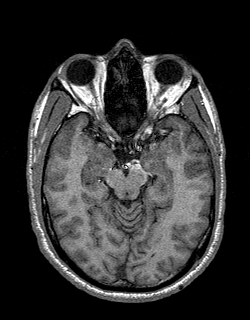
[im 86/144]
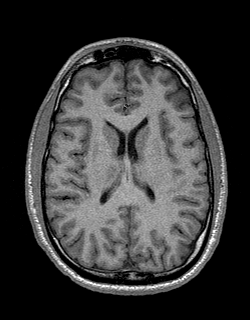
[im 115/144]
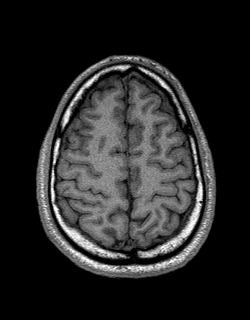
[im 144/144]
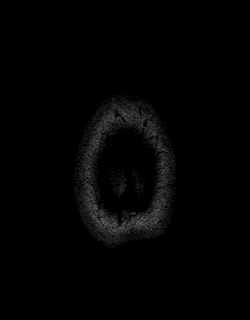

[Series 12: t2_spc_da-fl_sag_p2_iso · sagittal · 1.0mm · 1.02mm/px · 7 of 176 slices shown]
[im 1/176]
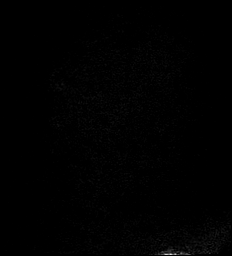
[im 30/176]
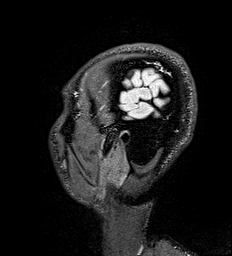
[im 59/176]
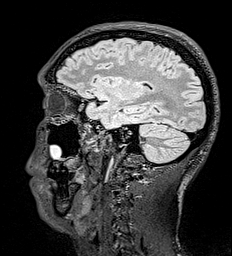
[im 88/176]
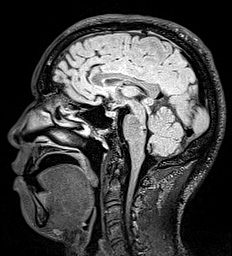
[im 117/176]
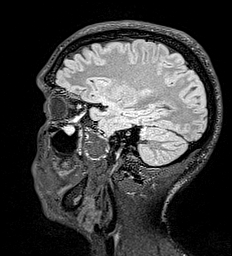
[im 146/176]
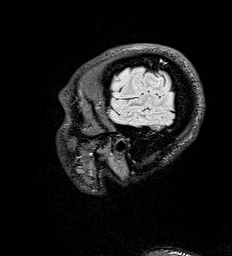
[im 176/176]
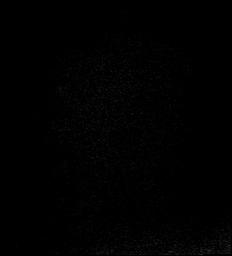

[Series 13: T2 · coronal · 5.0mm · 0.45mm/px · 1 of 28 slices shown (2 of 2)]
[im 1/28]
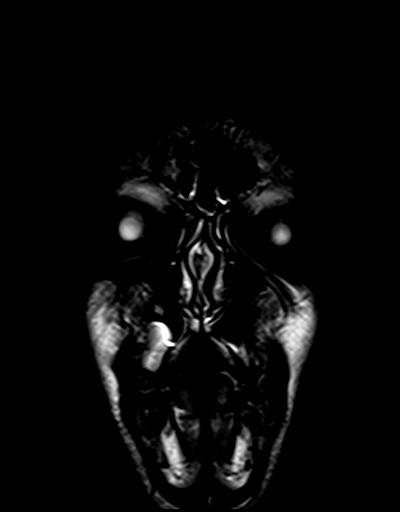

[Series 14: FLAIR · coronal · 1.5mm · 1.02mm/px · 5 of 129 slices shown (2 of 3)]
[im 1/129]
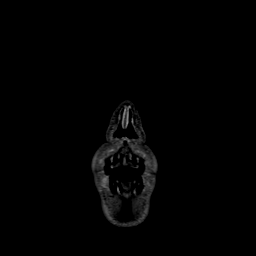
[im 33/129]
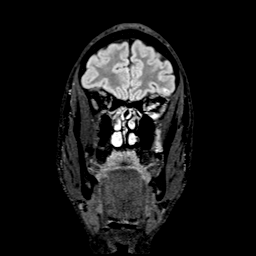
[im 65/129]
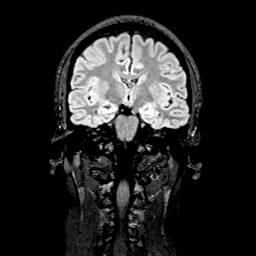
[im 97/129]
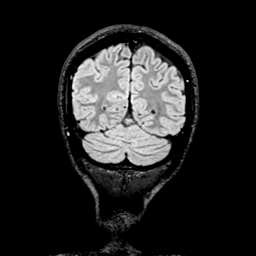
[im 129/129]
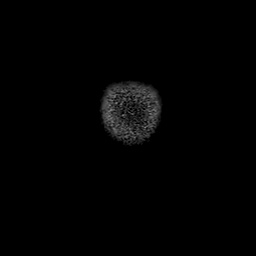

[Series 15: FLAIR · axial · 1.5mm · 1.02mm/px · z∈[-108,+119]mm · 6 of 154 slices shown (3 of 3)]
[im 1/154]
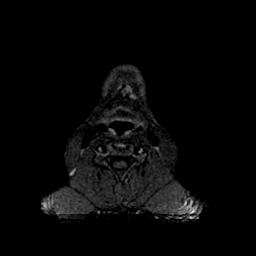
[im 31/154]
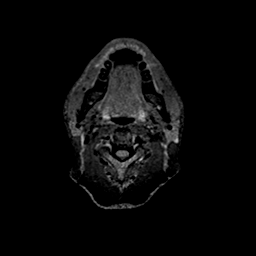
[im 62/154]
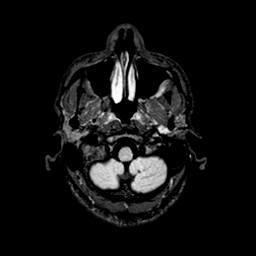
[im 92/154]
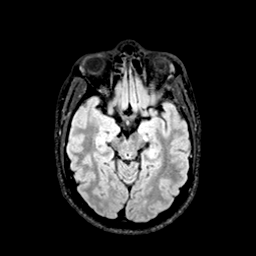
[im 123/154]
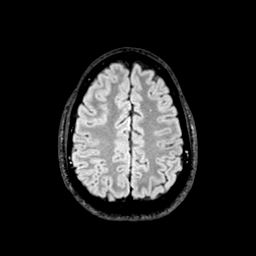
[im 154/154]
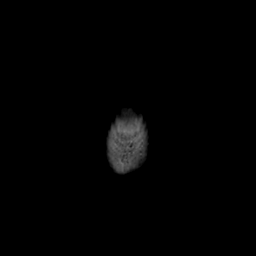

[Series 16: t1_mpr_tra post · axial · 1.0mm · 0.75mm/px · z∈[-35,+107]mm · 6 of 144 slices shown]
[im 1/144]
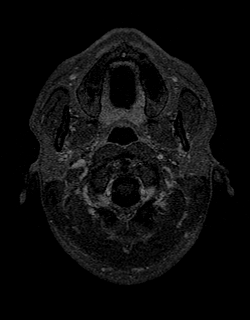
[im 29/144]
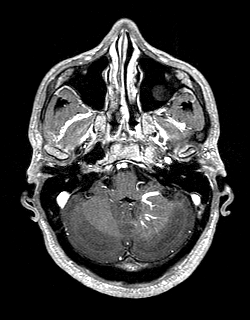
[im 58/144]
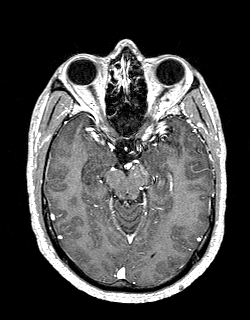
[im 86/144]
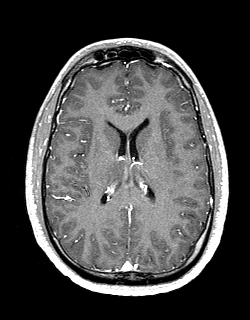
[im 115/144]
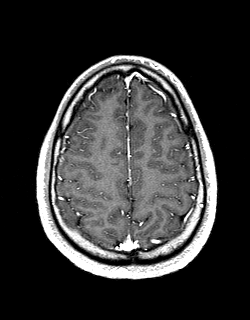
[im 144/144]
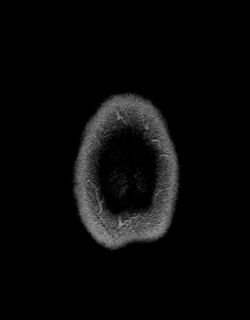

[Series 17: post cor · coronal · 5.0mm · 0.45mm/px · 1 of 28 slices shown]
[im 1/28]
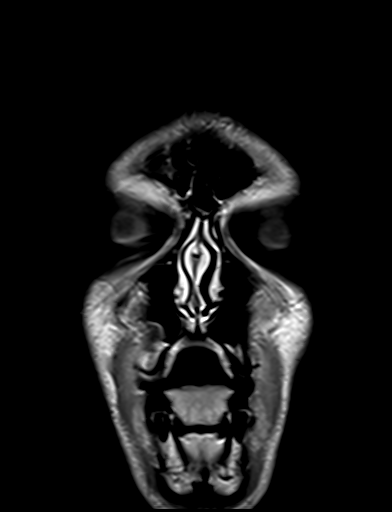

[Series 18: T1 post-contrast · sagittal · 5.0mm · 0.47mm/px · 1 of 23 slices shown]
[im 1/23]
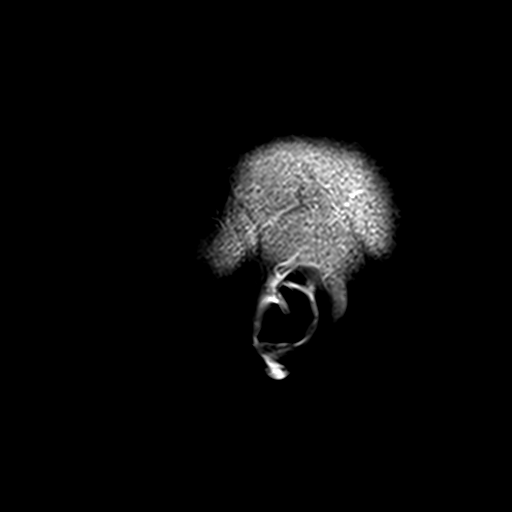

[48 of 48 positions shown; findings below may reference images not displayed]

FINDINGS: Brain: There is no acute infarction or intracranial hemorrhage.
There is no intracranial mass, mass effect, or edema. There is no
hydrocephalus or extra-axial fluid collection. Ventricles and sulci
are normal in size and configuration.

Vascular: Large left cerebellar developmental venous anomaly. There
is ectatic vein measuring up to 2 cm along the left cerebellopontine
angle. Major vessel flow voids at the skull base are preserved.

Skull and upper cervical spine: Normal marrow signal is preserved.

Sinuses/Orbits: Mild lobular mucosal thickening. Orbits are
unremarkable.

Other: Sella is unremarkable.  Mastoid air cells are clear.
IMPRESSION: Large left cerebellar developmental venous anomaly. There is an
ectatic draining vein along the left cerebellopontine angle that
likely reflects a varix. Notably, this is in proximity to the
cisternal left trigeminal nerve, which is not as well evaluated on
these large field-of-view images.

## 2023-11-09 ENCOUNTER — Other Ambulatory Visit: Payer: Self-pay | Admitting: Neurology

## 2023-11-12 ENCOUNTER — Other Ambulatory Visit: Payer: Self-pay | Admitting: Neurology

## 2023-11-21 ENCOUNTER — Other Ambulatory Visit: Payer: Self-pay | Admitting: Neurology

## 2023-11-21 ENCOUNTER — Encounter: Payer: Self-pay | Admitting: Neurology

## 2023-11-21 DIAGNOSIS — G500 Trigeminal neuralgia: Secondary | ICD-10-CM

## 2023-11-21 MED ORDER — BACLOFEN 10 MG PO TABS
10.0000 mg | ORAL_TABLET | Freq: Three times a day (TID) | ORAL | 0 refills | Status: AC
Start: 2023-11-21 — End: ?

## 2023-12-04 ENCOUNTER — Other Ambulatory Visit: Payer: Self-pay | Admitting: Neurology

## 2023-12-05 ENCOUNTER — Telehealth: Payer: Self-pay | Admitting: Neurology

## 2023-12-05 ENCOUNTER — Other Ambulatory Visit: Payer: Self-pay

## 2023-12-05 MED ORDER — CARBAMAZEPINE 200 MG PO TABS: 200.0000 mg | ORAL_TABLET | Freq: Two times a day (BID) | ORAL | 5 refills | Status: DC

## 2023-12-05 NOTE — Telephone Encounter (Signed)
 Medication filled.

## 2023-12-05 NOTE — Telephone Encounter (Signed)
 [place] [facility]  Carbamazepine  200mg  said he needs a refill. He is about to be out of medication.

## 2024-01-03 ENCOUNTER — Other Ambulatory Visit: Payer: Self-pay | Admitting: Dentistry

## 2024-01-03 DIAGNOSIS — M26632 Articular disc disorder of left temporomandibular joint: Secondary | ICD-10-CM

## 2024-01-16 ENCOUNTER — Emergency Department (HOSPITAL_COMMUNITY)
Admission: EM | Admit: 2024-01-16 | Discharge: 2024-01-16 | Disposition: A | Discharge status: Home / Self Care | Attending: Emergency Medicine | Admitting: Emergency Medicine

## 2024-01-16 ENCOUNTER — Encounter (HOSPITAL_COMMUNITY): Payer: Self-pay

## 2024-01-16 ENCOUNTER — Emergency Department (HOSPITAL_COMMUNITY)

## 2024-01-16 ENCOUNTER — Other Ambulatory Visit: Payer: Self-pay

## 2024-01-16 ENCOUNTER — Encounter: Payer: Self-pay | Admitting: Neurology

## 2024-01-16 DIAGNOSIS — R002 Palpitations: Secondary | ICD-10-CM | POA: Insufficient documentation

## 2024-01-16 DIAGNOSIS — D72829 Elevated white blood cell count, unspecified: Secondary | ICD-10-CM | POA: Insufficient documentation

## 2024-01-16 LAB — BASIC METABOLIC PANEL WITH GFR
Anion gap: 7 (ref 5–15)
BUN: 16 mg/dL (ref 6–20)
CO2: 24 mmol/L (ref 22–32)
Calcium: 9.1 mg/dL (ref 8.9–10.3)
Chloride: 107 mmol/L (ref 98–111)
Creatinine, Ser: 0.89 mg/dL (ref 0.61–1.24)
GFR, Estimated: >60 mL/min
Glucose, Bld: 101 mg/dL — ABNORMAL HIGH (ref 70–99)
Potassium: 3.9 mmol/L (ref 3.5–5.1)
Sodium: 138 mmol/L (ref 135–145)

## 2024-01-16 LAB — CBC
HCT: 42.4 % (ref 39.0–52.0)
Hemoglobin: 14.4 g/dL (ref 13.0–17.0)
MCH: 29.1 pg (ref 26.0–34.0)
MCHC: 34 g/dL (ref 30.0–36.0)
MCV: 85.7 fL (ref 80.0–100.0)
Platelets: 201 K/uL (ref 150–400)
RBC: 4.95 MIL/uL (ref 4.22–5.81)
RDW: 12.5 % (ref 11.5–15.5)
WBC: 4.3 K/uL (ref 4.0–10.5)
nRBC: 0 % (ref 0.0–0.2)

## 2024-01-16 LAB — D-DIMER, QUANTITATIVE: D-Dimer, Quant: <0.27 ug{FEU}/mL (ref 0.00–0.50)

## 2024-01-16 LAB — TROPONIN I (HIGH SENSITIVITY)
Troponin I (High Sensitivity): 4 ng/L (ref <18)
Troponin I (High Sensitivity): 4 ng/L (ref <18)

## 2024-01-16 NOTE — Discharge Instructions (Signed)
 Follow up with your Physician for recheck.  Return if any problems.

## 2024-01-16 NOTE — ED Triage Notes (Signed)
 Woke up this morning ~1am with heart palpitations.   Denies any pain.

## 2024-01-16 NOTE — ED Notes (Signed)
 Patient discharged to home with self. Patient given written and oral discharge instructions. Patient verbalized understanding. Patient ambulatory to exit with steady gait. Patient breathing without difficulty. Patient denies questions, concerns or  needs at this time.

## 2024-01-16 NOTE — ED Provider Notes (Signed)
 Patient's care assumed at 6:30 AM.  Patient is pending a second troponin.  Patient has trigeminal neuralgia and has discomfort from that.  Patient noted palpitations last night.  First troponin is negative second troponin returned and is negative.  Patient is counseled on palpitations.  He is advised to avoid stimulants caffeine.  He is advised to follow-up with primary care for recheck.  Patient is discharged in stable condition   Jesse Lisenby K, PA-C 01/16/24 [id number]    Mesner, Selinda, MD 01/17/24 [phone]

## 2024-01-16 NOTE — ED Notes (Signed)
 Patient transported to X-ray

## 2024-01-16 NOTE — ED Provider Notes (Signed)
 [place] EMERGENCY DEPARTMENT AT [facility] Provider Note   CSN: [id number] Arrival date & time: 01/16/24  9668     Patient presents with: Palpitations   Jesse Carney is a 43 y.o. male with medical history significant for trigeminal neuralgia.  Presents to ED complaining of palpitations.  States that he was up this morning with pain related to his trigeminal neuralgia.  States pain was extreme and he took gabapentin  because of this.  Reports after taking gabapentin  he developed palpitations.  States that he was able to go back to bed however was woken up later with increased palpitations.  He denies any chest pain or shortness of breath.  He reports that his wife took his blood pressure and it was elevated to 140 systolic.  Reports that his wife then palpated his pulse and noticed it was quivering.  He denies cardiac history.  He denies leg swelling, nausea, vomiting, lightheadedness, dizziness or weakness.   Palpitations      Prior to Admission medications   Medication Sig Start Date End Date Taking? Authorizing Provider  baclofen  (LIORESAL ) 10 MG tablet Take 1 tablet (10 mg total) by mouth 3 (three) times daily. 11/21/23   Leigh Venetia CROME, MD  carbamazepine  (TEGRETOL ) 200 MG tablet Take 1 tablet (200 mg total) by mouth 2 (two) times daily. 12/05/23   Skeet Juliene SAUNDERS, DO  gabapentin  (NEURONTIN ) 300 MG capsule Take 600 mg (two caps)  three times a day 03/13/23   Skeet Juliene SAUNDERS, DO    Allergies: Ibuprofen, Nsaids, and Oxcarbazepine     Review of Systems  Cardiovascular:  Positive for palpitations.  All other systems reviewed and are negative.   Updated Vital Signs BP (!) 158/100 (BP Location: Right Arm)   Pulse 94   Temp 97.6 F (36.4 C) (Oral)   Resp 16   Ht 5' 11 (1.803 m)   Wt 79.4 kg   SpO2 99%   BMI 24.41 kg/m   Physical Exam Vitals and nursing note reviewed.  Constitutional:      General: He is not in acute distress.    Appearance: He is  well-developed.  HENT:     Head: Normocephalic and atraumatic.  Eyes:     Conjunctiva/sclera: Conjunctivae normal.  Cardiovascular:     Rate and Rhythm: Normal rate and regular rhythm.     Heart sounds: No murmur heard. Pulmonary:     Effort: Pulmonary effort is normal. No respiratory distress.     Breath sounds: Normal breath sounds.  Abdominal:     Palpations: Abdomen is soft.     Tenderness: There is no abdominal tenderness.  Musculoskeletal:        General: No swelling.     Cervical back: Neck supple.  Skin:    General: Skin is warm and dry.     Capillary Refill: Capillary refill takes less than 2 seconds.  Neurological:     Mental Status: He is alert.  Psychiatric:        Mood and Affect: Mood normal.     (all labs ordered are listed, but only abnormal results are displayed) Labs Reviewed  BASIC METABOLIC PANEL WITH GFR - Abnormal; Notable for the following components:      Result Value   Glucose, Bld 101 (*)    All other components within normal limits  CBC  D-DIMER, QUANTITATIVE  TROPONIN I (HIGH SENSITIVITY)  TROPONIN I (HIGH SENSITIVITY)    EKG: None  Radiology: No results found.  Procedures  Medications Ordered in the ED - No data to display   Medical Decision Making Amount and/or Complexity of Data Reviewed Labs: ordered.   This is a 43 year old male presenting to the ED due to concern of palpitations.  On exam, he is afebrile and nontachycardic.  Lung sounds are clear bilaterally, no hypoxia.  Abdomen is soft and compressible.  Neuroexam at baseline.  No edema to bilateral lower extremities.  Overall patient is nontoxic in appearance.  Reassuring vital signs.  Will assess patient with CBC to assess for anemia, leukocytosis.  Metabolic panel assess for electrolyte derangement.  Will pull troponins to assess for cardiac stress, EKG and chest x-ray.  CBC without leukocytosis or anemia.  Metabolic panel is grossly unremarkable.  Troponin 4, delta  pending.  I have also added on D-dimer to rule out blood clot.  EKG is nonischemic and shows normal sinus rhythm without arrhythmia.  D-dimer negative.  Doubt PE.  At end of shift, patient is awaiting delta troponin. Signed out to oncoming provider [facility]. Plan of management discussed.      Final diagnoses:  None    ED Discharge Orders     None          Piper Albro F, PA-C 02/14/24 1826    Lorette Mayo, MD 02/19/24 [phone]

## 2024-01-18 ENCOUNTER — Ambulatory Visit
Admission: RE | Admit: 2024-01-18 | Discharge: 2024-01-18 | Disposition: A | Discharge status: Home / Self Care | Source: Ambulatory Visit | Attending: Dentistry | Admitting: Dentistry

## 2024-01-18 DIAGNOSIS — M26632 Articular disc disorder of left temporomandibular joint: Secondary | ICD-10-CM

## 2024-02-06 MED ORDER — CARBAMAZEPINE 200 MG PO TABS: 200.0000 mg | ORAL_TABLET | Freq: Three times a day (TID) | ORAL | 5 refills | Status: DC

## 2024-02-06 MED ORDER — CARBAMAZEPINE 200 MG PO TABS: 200.0000 mg | ORAL_TABLET | Freq: Three times a day (TID) | ORAL | 5 refills | Status: AC

## 2024-02-06 NOTE — Addendum Note (Signed)
 Addended by: GEORJEAN DARICE HERO on: 02/06/2024 09:41 AM   Modules accepted: Orders

## 2024-02-06 NOTE — Telephone Encounter (Signed)
 Spoke to patient, We may have to send in the new increase to the pharmacy.  Per Dr.Jaffe mychart message 01/16/24  I would increase the carbamazepine  to 200mg  three times daily. If no improvement in a week, we can still increase dose.    Increase sent in. Patient to check and see if he can pick up medication when the pharmacy opens.

## 2024-06-12 ENCOUNTER — Ambulatory Visit: Admitting: Family Medicine

## 2024-06-17 ENCOUNTER — Ambulatory Visit: Admitting: Neurology
